# Patient Record
Sex: Male | Born: 1990
Health system: Southern US, Community
[De-identification: ages and names within clinical notes are randomized; demographics above are authoritative.]

## PROBLEM LIST (undated history)

## (undated) DIAGNOSIS — F329 Major depressive disorder, single episode, unspecified: Secondary | ICD-10-CM

## (undated) DIAGNOSIS — R51 Headache: Secondary | ICD-10-CM

## (undated) DIAGNOSIS — F429 Obsessive-compulsive disorder, unspecified: Secondary | ICD-10-CM

## (undated) DIAGNOSIS — F988 Other specified behavioral and emotional disorders with onset usually occurring in childhood and adolescence: Secondary | ICD-10-CM

## (undated) DIAGNOSIS — F419 Anxiety disorder, unspecified: Secondary | ICD-10-CM

## (undated) DIAGNOSIS — F32A Depression, unspecified: Secondary | ICD-10-CM

## (undated) DIAGNOSIS — K611 Rectal abscess: Secondary | ICD-10-CM

## (undated) HISTORY — PX: WISDOM TOOTH EXTRACTION: SHX21

## (undated) HISTORY — DX: Headache: R51

## (undated) HISTORY — DX: Anxiety disorder, unspecified: F41.9

## (undated) HISTORY — DX: Depression, unspecified: F32.A

## (undated) HISTORY — DX: Other specified behavioral and emotional disorders with onset usually occurring in childhood and adolescence: F98.8

## (undated) HISTORY — DX: Major depressive disorder, single episode, unspecified: F32.9

## (undated) HISTORY — DX: Rectal abscess: K61.1

---
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
## 2011-06-29 ENCOUNTER — Other Ambulatory Visit (INDEPENDENT_AMBULATORY_CARE_PROVIDER_SITE_OTHER): Payer: Self-pay | Admitting: General Surgery

## 2011-06-29 ENCOUNTER — Encounter (INDEPENDENT_AMBULATORY_CARE_PROVIDER_SITE_OTHER): Payer: Self-pay | Admitting: General Surgery

## 2011-06-29 ENCOUNTER — Ambulatory Visit (INDEPENDENT_AMBULATORY_CARE_PROVIDER_SITE_OTHER): Payer: Private Health Insurance - Indemnity | Admitting: General Surgery

## 2011-06-29 ENCOUNTER — Telehealth (INDEPENDENT_AMBULATORY_CARE_PROVIDER_SITE_OTHER): Payer: Self-pay | Admitting: General Surgery

## 2011-06-29 VITALS — BP 126/86 | HR 92 | Temp 97.0°F | Resp 16 | Ht 68.0 in | Wt 162.8 lb

## 2011-06-29 DIAGNOSIS — K612 Anorectal abscess: Secondary | ICD-10-CM

## 2011-06-29 DIAGNOSIS — K611 Rectal abscess: Secondary | ICD-10-CM

## 2011-06-29 MED ORDER — OXYCODONE-ACETAMINOPHEN 5-325 MG PO TABS: 1.0000 | ORAL_TABLET | Freq: Four times a day (QID) | ORAL | Status: DC | PRN

## 2011-06-29 NOTE — Patient Instructions (Signed)
Stop all antibiotics except for doxycycline.   Abscess/Boil Care After (Furuncle) An abscess (also called a boil or furuncle) is an infected area that contains a collection of pus. Signs and symptoms of an abscess include pain, tenderness, redness, or hardness, or you may feel a moveable soft area under your skin. An abscess can occur anywhere in the body. The infection may spread to surrounding tissues causing cellulitis. A cut (incision) by the surgeon was made over your abscess and the pus was drained out. Gauze may have been packed into the space to provide a drain that will allow the cavity to heal from the inside outwards. The boil may be painful for 5 to 7 days. Most people with a boil do not have high fevers. Your abscess, if seen early, may not have localized, and may not have been lanced. If not, another appointment may be required for this if it does not get better on its own or with medications. HOME CARE INSTRUCTIONS  Only take over-the-counter or prescription medicines for pain, discomfort, or fever as directed by your caregiver.   When you bathe, soak and then remove gauze or iodoform packs at least daily or as directed by your caregiver. You may then wash the wound gently with mild soapy water. Repack with gauze or do as your caregiver directs.  SEEK IMMEDIATE MEDICAL CARE IF:  You develop increased pain, swelling, redness, drainage, or bleeding in the wound site.   You develop signs of generalized infection including muscle aches, chills, fever, or a general ill feeling.   An oral temperature above 101 develops, not controlled by medication.  See your caregiver for a recheck if you develop any of the symptoms described above. If medications (antibiotics) were prescribed, take them as directed. Document Released: 04/06/2005 Document Re-Released: 03/08/2010 [facility] Patient Information 2011 [place], [place].

## 2011-06-30 NOTE — Telephone Encounter (Signed)
Called and spoke with Patient's mom. Made an appt for him next Friday at 4pm. To call if that is not a good date/time.

## 2011-06-30 NOTE — Progress Notes (Signed)
Chief complaint: I have severe pain around the rectum. It hurts to sit down.  History of present illness: 20 year old male comes in complaining of severe rectal pain. The discomfort started last Tuesday. At first he just noticed a mild ache. The discomfort gradually worsened throughout the week. On Sunday it became acutely painful. He had a difficult time sitting down or laying on his left side. He denies any injury to the area. He said he was having a difficult time urinating. Whenever he would try to finish his stream it would be severely painful. He went to an urgent care walk-in clinic on Sunday. He states that he went to [facility] on Monday and saw a urologist. He states that he was told his prostate was enlarged and he was started on doxycycline. He saw Dr. Tenny Craw yesterday and was found to have a perirectal abscess. He states that Dr. Tenny Craw squeezed it. Although it was uncomfortable while it was being squeezed, he did get some relief afterwards. He is now on at least 3 different antibiotics. He was started on Cipro and Flagyl yesterday. He received an injection of Rocephin in Dr. Tenny Craw is office he denies any fevers or chills. He says some infrequent stool since all this started. He did have a bowel movement today. He denies any pain with defecation.  Past Medical History  Diagnosis Date  . ADD (attention deficit disorder)   . Depression   . Anxiety   . Perirectal abscess    Past Surgical History  Procedure Date  . Wisdom tooth extraction    No Known Allergies  Current Outpatient Prescriptions  Medication Sig Dispense Refill  . amphetamine-dextroamphetamine (ADDERALL XR, 30MG ,) 30 MG 24 hr capsule Take 30 mg by mouth every morning.        . ciprofloxacin (CIPRO) 750 MG tablet Take 750 mg by mouth 2 (two) times daily.        Marland Kitchen doxycycline (VIBRAMYCIN) 100 MG capsule Take 100 mg by mouth 2 (two) times daily.        Marland Kitchen ibuprofen (ADVIL,MOTRIN) 800 MG tablet Take 800 mg  by mouth every 8 (eight) hours as needed.        . metroNIDAZOLE (FLAGYL) 500 MG tablet Take 500 mg by mouth 3 (three) times daily.        . phenazopyridine (PYRIDIUM) 200 MG tablet Take 200 mg by mouth 3 (three) times daily as needed.        Marland Kitchen oxyCODONE-acetaminophen (ROXICET) 5-325 MG per tablet Take 1-2 tablets by mouth every 6 (six) hours as needed for pain. Disp 40 tablets  40 tablet  0   Family History  Problem Relation Age of Onset  . Heart disease Father    History  Substance Use Topics  . Smoking status: Current Everyday Smoker -- 0.2 packs/day  . Smokeless tobacco: Not on file  . Alcohol Use: 0.0 oz/week    7-14 Cans of beer per week   PE: BP 126/86  Pulse 92  Temp(Src) 97 F (36.1 C) (Temporal)  Resp 16  Ht 5\' 8"  (1.727 m)  Wt 162 lb 12.8 oz (73.846 kg)  BMI 24.75 kg/m2 Well-developed well-nourished male who appears uncomfortable. Pulmonary-lungs are clear to auscultation, symmetric chest rise Cardiovascular-regular rate and rhythm Abdomen-soft, nontender, nondistended Skin-no jaundice, no rash HEENT-atraumatic, normocephalic, no scleral icterus, neck supple Rectal-in the left anterior perianal area he has a large fluctuant area. It is the size of the line. There is really no cellulitis  over it. However he is exquisitely tender in that area. Digital rectal exam was deferred secondary to patient discomfort  Data reviewed: i reviewed Dr. Tenny Craw is a note from June 28, 2011  Assessment and plan: Large left anterior perirectal abscess  I recommended incision and drainage in the office. We discussed the risk and benefits of the procedure including but not limited to bleeding, worsening infection, urinary retention, need for additional procedures, scarring, possible fistula formation  After obtaining verbal consent, the area was prepped with Betadine. 10 cc of 1% Xylocaine mixed with epinephrine and 1 cc of sodium bicarbonate were injected in the area. I then made a 2  inch oblique incision in the left anterior perirectal area. There was a large amount of purulent drainage. It was slightly blood-tinged as well. The cavity was probed as best I could. The cavity was packed with quarter inch iodoform gauze and covered with dry gauze.  The patient was given educational material. He was given wound care material. i told the patient to stop all other antibiotics and just finish out the doxycycline. We did send cultures from that area. He was given a prescription for pain medication. We will see him in about a week for recheck. He was given instructions on what to call for

## 2011-07-04 LAB — WOUND CULTURE: Gram Stain: NONE SEEN

## 2011-07-07 ENCOUNTER — Encounter (INDEPENDENT_AMBULATORY_CARE_PROVIDER_SITE_OTHER): Payer: Self-pay | Admitting: General Surgery

## 2011-07-07 ENCOUNTER — Ambulatory Visit (INDEPENDENT_AMBULATORY_CARE_PROVIDER_SITE_OTHER): Payer: Private Health Insurance - Indemnity | Admitting: General Surgery

## 2011-07-07 VITALS — BP 116/78 | HR 92 | Temp 97.6°F | Ht 69.0 in | Wt 163.8 lb

## 2011-07-07 DIAGNOSIS — K611 Rectal abscess: Secondary | ICD-10-CM

## 2011-07-07 DIAGNOSIS — K612 Anorectal abscess: Secondary | ICD-10-CM

## 2011-07-07 NOTE — Progress Notes (Signed)
20 year old male comes in for followup after undergoing incision and drainage of a left anterior perirectal abscess in the office on September 27. He states that he is doing much better. He is no longer having trouble ambulating. He is no longer having trouble urinating. He denies any fevers, chills, nausea, or vomiting. He denies any diarrhea or constipation. He denies any dysuria. He has 2 more days of antibiotics to finish.  PE: BP 116/78  Pulse 92  Temp 97.6 F (36.4 C)  Ht 5\' 9"  (1.753 m)  Wt 163 lb 12.8 oz (74.299 kg)  BMI 24.19 kg/m2 Well-developed well-nourished male in no apparent distress Rectal-healing incision in the left anterior perianal space, approximately 1-1/2 inches in length. There is no cellulitis, fluctuance, or induration. There is a one drop of yellow tinge drainage from the incision. The skin edges are almost completely approximated. Nontender on exam  Cultures-grew Escherichia coli  Assessment and plan: Status post incision and drainage of large left perirectal abscess  Doing well. There is no sign of active infection. I let him finish his doxycycline. He was given a note for school for today. I will see him in 4 weeks

## 2011-07-07 NOTE — Patient Instructions (Signed)
Your abscess is essentially resolved.  We will check you again in several weeks

## 2011-08-11 ENCOUNTER — Encounter (INDEPENDENT_AMBULATORY_CARE_PROVIDER_SITE_OTHER): Payer: Private Health Insurance - Indemnity | Admitting: General Surgery

## 2011-10-06 ENCOUNTER — Encounter (INDEPENDENT_AMBULATORY_CARE_PROVIDER_SITE_OTHER): Payer: Private Health Insurance - Indemnity | Admitting: General Surgery

## 2011-10-26 ENCOUNTER — Encounter: Payer: Self-pay | Admitting: Internal Medicine

## 2011-10-27 ENCOUNTER — Encounter: Payer: Self-pay | Admitting: Internal Medicine

## 2011-11-02 ENCOUNTER — Other Ambulatory Visit (INDEPENDENT_AMBULATORY_CARE_PROVIDER_SITE_OTHER): Payer: Private Health Insurance - Indemnity

## 2011-11-02 ENCOUNTER — Other Ambulatory Visit: Payer: Self-pay | Admitting: Gastroenterology

## 2011-11-02 ENCOUNTER — Encounter: Payer: Self-pay | Admitting: Internal Medicine

## 2011-11-02 ENCOUNTER — Ambulatory Visit (INDEPENDENT_AMBULATORY_CARE_PROVIDER_SITE_OTHER): Payer: Self-pay | Admitting: Internal Medicine

## 2011-11-02 DIAGNOSIS — F902 Attention-deficit hyperactivity disorder, combined type: Secondary | ICD-10-CM | POA: Insufficient documentation

## 2011-11-02 DIAGNOSIS — R5381 Other malaise: Secondary | ICD-10-CM

## 2011-11-02 DIAGNOSIS — K5900 Constipation, unspecified: Secondary | ICD-10-CM

## 2011-11-02 DIAGNOSIS — K594 Anal spasm: Secondary | ICD-10-CM

## 2011-11-02 DIAGNOSIS — K589 Irritable bowel syndrome without diarrhea: Secondary | ICD-10-CM

## 2011-11-02 DIAGNOSIS — R5383 Other fatigue: Secondary | ICD-10-CM

## 2011-11-02 LAB — CBC WITH DIFFERENTIAL/PLATELET
Basophils Absolute: 0 10*3/uL (ref 0.0–0.1)
Basophils Relative: 0.6 % (ref 0.0–3.0)
Eosinophils Absolute: 0.1 10*3/uL (ref 0.0–0.7)
Eosinophils Relative: 3.6 % (ref 0.0–5.0)
HCT: 45.4 % (ref 39.0–52.0)
Hemoglobin: 15.8 g/dL (ref 13.0–17.0)
Lymphocytes Relative: 39.1 % (ref 12.0–46.0)
Lymphs Abs: 1.2 10*3/uL (ref 0.7–4.0)
MCHC: 34.9 g/dL (ref 30.0–36.0)
MCV: 87.2 fl (ref 78.0–100.0)
Monocytes Absolute: 0.3 10*3/uL (ref 0.1–1.0)
Monocytes Relative: 9.6 % (ref 3.0–12.0)
Neutro Abs: 1.5 10*3/uL (ref 1.4–7.7)
Neutrophils Relative %: 47.1 % (ref 43.0–77.0)
Platelets: 250 10*3/uL (ref 150.0–400.0)
RBC: 5.2 Mil/uL (ref 4.22–5.81)
RDW: 12.4 % (ref 11.5–14.6)
WBC: 3.2 10*3/uL — ABNORMAL LOW (ref 4.5–10.5)

## 2011-11-02 LAB — COMPREHENSIVE METABOLIC PANEL
ALT: 30 U/L (ref 0–53)
AST: 25 U/L (ref 0–37)
Albumin: 4.9 g/dL (ref 3.5–5.2)
Alkaline Phosphatase: 66 U/L (ref 39–117)
BUN: 15 mg/dL (ref 6–23)
CO2: 30 mEq/L (ref 19–32)
Calcium: 10.2 mg/dL (ref 8.4–10.5)
Chloride: 103 mEq/L (ref 96–112)
Creatinine, Ser: 0.9 mg/dL (ref 0.4–1.5)
GFR: 109.77 mL/min (ref >60.00)
Glucose, Bld: 80 mg/dL (ref 70–99)
Potassium: 4.8 mEq/L (ref 3.5–5.1)
Sodium: 140 mEq/L (ref 135–145)
Total Bilirubin: 0.8 mg/dL (ref 0.3–1.2)
Total Protein: 8.1 g/dL (ref 6.0–8.3)

## 2011-11-02 LAB — TSH: TSH: 2.11 u[IU]/mL (ref 0.35–5.50)

## 2011-11-02 MED ORDER — ALIGN 4 MG PO CAPS: 1.0000 | ORAL_CAPSULE | Freq: Every day | ORAL | Status: DC

## 2011-11-02 MED ORDER — HYOSCYAMINE SULFATE 0.125 MG SL SUBL: 0.1250 mg | SUBLINGUAL_TABLET | SUBLINGUAL | Status: AC | PRN

## 2011-11-02 NOTE — Progress Notes (Signed)
Addended by: Adonis Housekeeper A on: 11/02/2011 02:48 PM   Modules accepted: Orders
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
## 2011-11-02 NOTE — Patient Instructions (Addendum)
Your physician has requested that you go to the basement for the following lab work before leaving today: CBC, CMP, TSH, Celiac  We have sent the following medications to your pharmacy for you to pick up at your convenience: Levsin, Align  Dr. Rhea Belton would like to see you back in the office in 4 weeks for a follow up

## 2011-11-03 LAB — IGA: IgA: 134 mg/dL (ref 68–378)

## 2011-11-03 LAB — TISSUE TRANSGLUTAMINASE, IGA: Tissue Transglutaminase Ab, IgA: 1.5 U/mL (ref <20)

## 2011-11-06 ENCOUNTER — Telehealth: Payer: Self-pay | Admitting: *Deleted

## 2011-11-06 NOTE — Telephone Encounter (Signed)
Informed pt of Dr Lauro Franklin findings and recommendations and they will proceed with the plan of care per last OV; pt stated understanding.

## 2011-11-06 NOTE — Telephone Encounter (Signed)
Message copied by Florene Glen on Mon Nov 06, 2011  3:05 PM ------      Message from: Beverley Fiedler      Created: Fri Nov 03, 2011  3:43 PM       All labs reassuring, thyroid is normal, celiac panel is negative      Plan and followup as per note

## 2016-04-18 DIAGNOSIS — F902 Attention-deficit hyperactivity disorder, combined type: Secondary | ICD-10-CM | POA: Diagnosis not present

## 2016-06-16 DIAGNOSIS — F902 Attention-deficit hyperactivity disorder, combined type: Secondary | ICD-10-CM | POA: Diagnosis not present

## 2016-09-12 DIAGNOSIS — F902 Attention-deficit hyperactivity disorder, combined type: Secondary | ICD-10-CM | POA: Diagnosis not present

## 2016-12-07 DIAGNOSIS — L2089 Other atopic dermatitis: Secondary | ICD-10-CM | POA: Diagnosis not present

## 2017-01-11 DIAGNOSIS — F902 Attention-deficit hyperactivity disorder, combined type: Secondary | ICD-10-CM | POA: Diagnosis not present

## 2017-04-11 DIAGNOSIS — H5213 Myopia, bilateral: Secondary | ICD-10-CM | POA: Diagnosis not present

## 2017-06-13 DIAGNOSIS — F902 Attention-deficit hyperactivity disorder, combined type: Secondary | ICD-10-CM | POA: Diagnosis not present

## 2017-10-29 DIAGNOSIS — F902 Attention-deficit hyperactivity disorder, combined type: Secondary | ICD-10-CM | POA: Diagnosis not present

## 2018-01-03 DIAGNOSIS — F4323 Adjustment disorder with mixed anxiety and depressed mood: Secondary | ICD-10-CM | POA: Diagnosis not present

## 2018-02-18 DIAGNOSIS — F902 Attention-deficit hyperactivity disorder, combined type: Secondary | ICD-10-CM | POA: Diagnosis not present

## 2018-03-03 ENCOUNTER — Encounter (HOSPITAL_COMMUNITY): Payer: Self-pay | Admitting: *Deleted

## 2018-03-03 ENCOUNTER — Other Ambulatory Visit: Payer: Self-pay

## 2018-03-03 ENCOUNTER — Emergency Department (HOSPITAL_COMMUNITY)
Admission: EM | Admit: 2018-03-03 | Discharge: 2018-03-04 | Disposition: A | Discharge status: Home / Self Care | Payer: BLUE CROSS/BLUE SHIELD | Attending: Emergency Medicine | Admitting: Emergency Medicine

## 2018-03-03 DIAGNOSIS — F329 Major depressive disorder, single episode, unspecified: Secondary | ICD-10-CM | POA: Insufficient documentation

## 2018-03-03 DIAGNOSIS — F17210 Nicotine dependence, cigarettes, uncomplicated: Secondary | ICD-10-CM | POA: Insufficient documentation

## 2018-03-03 DIAGNOSIS — F4325 Adjustment disorder with mixed disturbance of emotions and conduct: Secondary | ICD-10-CM | POA: Diagnosis not present

## 2018-03-03 DIAGNOSIS — T887XXA Unspecified adverse effect of drug or medicament, initial encounter: Secondary | ICD-10-CM | POA: Diagnosis not present

## 2018-03-03 DIAGNOSIS — Z79899 Other long term (current) drug therapy: Secondary | ICD-10-CM | POA: Diagnosis not present

## 2018-03-03 DIAGNOSIS — T50904A Poisoning by unspecified drugs, medicaments and biological substances, undetermined, initial encounter: Secondary | ICD-10-CM | POA: Diagnosis not present

## 2018-03-03 DIAGNOSIS — T43222A Poisoning by selective serotonin reuptake inhibitors, intentional self-harm, initial encounter: Secondary | ICD-10-CM | POA: Diagnosis not present

## 2018-03-03 DIAGNOSIS — Z7289 Other problems related to lifestyle: Secondary | ICD-10-CM | POA: Insufficient documentation

## 2018-03-03 HISTORY — DX: Obsessive-compulsive disorder, unspecified: F42.9

## 2018-03-03 LAB — CBC WITH DIFFERENTIAL/PLATELET
Basophils Absolute: 0 10*3/uL (ref 0.0–0.1)
Basophils Relative: 1 %
Eosinophils Absolute: 0.1 10*3/uL (ref 0.0–0.7)
Eosinophils Relative: 2 %
HCT: 41.9 % (ref 39.0–52.0)
Hemoglobin: 14.2 g/dL (ref 13.0–17.0)
Lymphocytes Relative: 18 %
Lymphs Abs: 1.2 10*3/uL (ref 0.7–4.0)
MCH: 29.2 pg (ref 26.0–34.0)
MCHC: 33.9 g/dL (ref 30.0–36.0)
MCV: 86 fL (ref 78.0–100.0)
Monocytes Absolute: 0.5 10*3/uL (ref 0.1–1.0)
Monocytes Relative: 8 %
Neutro Abs: 4.7 10*3/uL (ref 1.7–7.7)
Neutrophils Relative %: 71 %
Platelets: 297 10*3/uL (ref 150–400)
RBC: 4.87 MIL/uL (ref 4.22–5.81)
RDW: 12.5 % (ref 11.5–15.5)
WBC: 6.6 10*3/uL (ref 4.0–10.5)

## 2018-03-03 LAB — COMPREHENSIVE METABOLIC PANEL
ALT: 17 U/L (ref 17–63)
AST: 19 U/L (ref 15–41)
Albumin: 4.7 g/dL (ref 3.5–5.0)
Alkaline Phosphatase: 63 U/L (ref 38–126)
Anion gap: 12 (ref 5–15)
BUN: 15 mg/dL (ref 6–20)
CO2: 22 mmol/L (ref 22–32)
Calcium: 9.2 mg/dL (ref 8.9–10.3)
Chloride: 104 mmol/L (ref 101–111)
Creatinine, Ser: 0.76 mg/dL (ref 0.61–1.24)
GFR calc Af Amer: >60 mL/min (ref >60)
GFR calc non Af Amer: >60 mL/min (ref >60)
Glucose, Bld: 92 mg/dL (ref 65–99)
Potassium: 3.7 mmol/L (ref 3.5–5.1)
Sodium: 138 mmol/L (ref 135–145)
Total Bilirubin: 0.6 mg/dL (ref 0.3–1.2)
Total Protein: 7.3 g/dL (ref 6.5–8.1)

## 2018-03-03 LAB — SALICYLATE LEVEL: Salicylate Lvl: <7 mg/dL (ref 2.8–30.0)

## 2018-03-03 LAB — ACETAMINOPHEN LEVEL: Acetaminophen (Tylenol), Serum: <10 ug/mL — ABNORMAL LOW (ref 10–30)

## 2018-03-03 LAB — ETHANOL: Alcohol, Ethyl (B): 15 mg/dL — ABNORMAL HIGH (ref <10)

## 2018-03-03 NOTE — Progress Notes (Signed)
TTS consulted with Donell SievertSpencer Simon, PA who recommends continued observation for safety and stabilization.  Princess BruinsAquicha Lawrance Wiedemann, MSW, LCSW Therapeutic Triage Specialist  [phone]

## 2018-03-03 NOTE — BH Assessment (Addendum)
Assessment Note  Corey Allison is an 27 y.o. male who presents to the ED voluntarily accompanied by his wife and his mother. Pt states he is currently separated from his wife and she is no longer living in the home. Pt reports he intentionally ingested 700 mg of Luvox after an argument with his wife. Pt states he and his wife were having a lunch date and trying to discuss their issues when they began to argue. Pt states he "just popped some pills" but states he does not know exactly how much he took. Pt states the act was impulsive and he was not thinking but denies that he was trying to kill himself when he took the medication. Pt states he just wanted to "calm down, take the edge off, and just escape." Pt admits he consumed several alcoholic drinks which he believes exacerbated his negative feelings. 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   Pt denies HI and denies AVH. Pt states he has individuals that he does not get along with that he would fight if he could but he does not have any intent or plans to harm others.   TTS consulted with Patriciaann Clan, PA who recommends continued observation for safety and stabilization. Tai, Delman Cheadle, RN notified of disposition and RN states she will inform EDP.  Diagnosis: Unspecified depressive d/o; OCD, severe   Past Medical History:  Past Medical History:  Diagnosis Date  . ADD (attention deficit disorder)   . Anxiety   . Depression   . Headache(784.0)   . OCD (obsessive compulsive disorder)   . Perirectal abscess     Past Surgical History:  Procedure Laterality Date  . WISDOM TOOTH EXTRACTION      Family History:  Family History  Problem Relation Age of Onset  . Heart disease Father   . Celiac disease Father   . Kidney disease Maternal Grandfather   . Colon cancer Neg Hx     Social History:  reports that he quit smoking about 6 years ago. His smoking use included  cigarettes. He smoked 0.25 packs per day. He has never used smokeless tobacco. He reports that he drinks about 3.0 oz of alcohol per week. He reports that he does not use drugs.  Additional Social History:  Alcohol / Drug Use Pain Medications: See MAR Prescriptions: See MAR Over the Counter: See MAR History of alcohol / drug use?: Yes Substance #1 Name of Substance 1: Alcohol 1 - Age of First Use: unknown 1 - Amount (size/oz): varies, BAL 15 on arrival to ED 1 - Frequency: occasional, socially  1 - Duration: ongoing 1 - Last Use / Amount: 03/03/18  CIWA: CIWA-Ar BP: 123/90 Pulse Rate: 69 COWS:    Allergies: No Known Allergies  Home Medications:  (Not in a hospital admission)  OB/GYN Status:  No LMP for male patient.  General Assessment Data Location of Assessment: [facility] TTS Assessment: In system Is this a Tele or Face-to-Face Assessment?: Face-to-Face Is this an Initial Assessment or a Re-assessment for this encounter?: Initial Assessment Marital status: Separated Is patient pregnant?: No Pregnancy Status: No Living Arrangements: Alone Can pt return to current living arrangement?: Yes Admission Status: Voluntary Is patient capable of signing voluntary admission?: Yes Referral Source: Self/Family/Friend Insurance type: BCBS     Crisis Care Plan Living Arrangements: Alone Name of Psychiatrist: Dr. Creig Hines, MD Name of Therapist: none  Education Status  Is patient currently in school?: No Is the patient employed, unemployed or receiving disability?: Employed  Risk to self with the past 6 months Suicidal Ideation: No Has patient been a risk to self within the past 6 months prior to admission? : Yes Suicidal Intent: No Has patient had any suicidal intent within the past 6 months prior to admission? : No Is patient at risk for suicide?: Yes Suicidal Plan?: No Has patient had any suicidal plan within the past 6 months prior to admission? : No Access to Means:  Yes Specify Access to Suicidal Means: pt intentionally ingested 700 mg of Luvox and has access to the medication  What has been your use of drugs/alcohol within the last 12 months?: reports to social alcohol use  Previous Attempts/Gestures: No Triggers for Past Attempts: None known Intentional Self Injurious Behavior: None Family Suicide History: No Recent stressful life event(s): Conflict (Comment), Other (Comment)(conflict with wife, work stress) Persecutory voices/beliefs?: No Depression: Yes Depression Symptoms: Insomnia, Feeling angry/irritable, Guilt Substance abuse history and/or treatment for substance abuse?: No Suicide prevention information given to non-admitted patients: Not applicable  Risk to Others within the past 6 months Homicidal Ideation: No Does patient have any lifetime risk of violence toward others beyond the six months prior to admission? : No Thoughts of Harm to Others: No Current Homicidal Intent: No Current Homicidal Plan: No Access to Homicidal Means: No History of harm to others?: No Assessment of Violence: None Noted Does patient have access to weapons?: Yes (Comment)(pt has handguns in the home ) Criminal Charges Pending?: No Does patient have a court date: No Is patient on probation?: No  Psychosis Hallucinations: None noted Delusions: None noted  Mental Status Report Appearance/Hygiene: In hospital gown Eye Contact: Good Motor Activity: Freedom of movement Speech: Logical/coherent Level of Consciousness: Alert Mood: Depressed, Guilty, Anxious Affect: Depressed, Anxious Anxiety Level: Moderate Thought Processes: Relevant, Coherent Judgement: Impaired Orientation: Place, Appropriate for developmental age, Situation, Time, Person Obsessive Compulsive Thoughts/Behaviors: Severe  Cognitive Functioning Concentration: Normal Memory: Remote Intact, Recent Intact Is patient IDD: No Is patient DD?: No Insight: Poor Impulse Control:  Poor Appetite: Good Have you had any weight changes? : No Change Sleep: Decreased Total Hours of Sleep: 6 Vegetative Symptoms: None  ADLScreening [facility] Assessment Services) Patient's cognitive ability adequate to safely complete daily activities?: Yes Patient able to express need for assistance with ADLs?: Yes Independently performs ADLs?: Yes (appropriate for developmental age)  Prior Inpatient Therapy Prior Inpatient Therapy: No  Prior Outpatient Therapy Prior Outpatient Therapy: Yes Prior Therapy Dates: current Prior Therapy Facilty/Provider(s): Dr. Creig Hines, MD Reason for Treatment: MED MANAGEMENT  Does patient have an ACCT team?: No Does patient have Intensive In-House Services?  : No Does patient have [facility] services? : No Does patient have P4CC services?: No  ADL Screening (condition at time of admission) Patient's cognitive ability adequate to safely complete daily activities?: Yes Is the patient deaf or have difficulty hearing?: No Does the patient have difficulty seeing, even when wearing glasses/contacts?: No Does the patient have difficulty concentrating, remembering, or making decisions?: No Patient able to express need for assistance with ADLs?: Yes Does the patient have difficulty dressing or bathing?: No Independently performs ADLs?: Yes (appropriate for developmental age) Does the patient have difficulty walking or climbing stairs?: No Weakness of Legs: None Weakness of Arms/Hands: None  Home Assistive Devices/Equipment Home Assistive Devices/Equipment: Eyeglasses    Abuse/Neglect Assessment (Assessment to be complete while patient is alone) Abuse/Neglect Assessment Can Be Completed: Yes  Physical Abuse: Denies Verbal Abuse: Denies Sexual Abuse: Denies Exploitation of patient/patient's resources: Denies Self-Neglect: Denies     Regulatory affairs officer (For Healthcare) Does Patient Have a Medical Advance Directive?: No Would patient like information on  creating a medical advance directive?: No - Patient declined    Additional Information 1:1 In Past 12 Months?: No CIRT Risk: No Elopement Risk: No Does patient have medical clearance?: Yes     Disposition: TTS consulted with Patriciaann Clan, PA who recommends continued observation for safety and stabilization. Tai, Delman Cheadle, RN notified of disposition and RN states she will inform EDP.  Disposition Initial Assessment Completed for this Encounter: Yes Disposition of Patient: (overnight OBS pending reassessment in AM by psych) Patient refused recommended treatment: No  On Site Evaluation by:   Reviewed with Physician:    Lyanne Co 03/04/2018 12:24 AM

## 2018-03-03 NOTE — ED Triage Notes (Signed)
Pt bib EMS and presents after taking 700 mg of Luvox at about 6:45pm this evening.  Pt denies SI with EMS.  Pt was arguing with significant other today and took the medication to "clear his head."  Pt is slow in responding and states his is cloudy.

## 2018-03-03 NOTE — ED Notes (Signed)
Pt aware of need for urine sample.  

## 2018-03-03 NOTE — ED Notes (Signed)
Bed: [id number] Expected date:  Expected time:  Means of arrival:  Comments: O/D Male

## 2018-03-03 NOTE — ED Provider Notes (Signed)
[name]-EMERGENCY DEPT Provider Note   CSN: [id number] Arrival date & time: 03/03/18  2018     History   Chief Complaint Chief Complaint  Patient presents with  . Ingestion    HPI Corey Allison is a 27 y.o. male.  27 year old male with history of ADD and depression anxiety presents after intentional overdose of 700 mg of Luvox prior to arrival.  States that he took this after an argument with his wife and was trying to come down.  He also admits to drinking 2 alcoholic beverages as well 2.  Denies that this was a suicide attempt.  Has no prior history of suicide attempt.  Notes that he has not been responding to internal stimuli.  Denies any auditory or visual hallucinations.  No other ingestions.     Past Medical History:  Diagnosis Date  . ADD (attention deficit disorder)   . Anxiety   . Depression   . Headache   . Perirectal abscess     Patient Active Problem List   Diagnosis Date Noted  . IBS (irritable bowel syndrome) 11/02/2011  . ADHD (attention deficit hyperactivity disorder) 11/02/2011  . Fatigue 11/02/2011  . left anterior Perirectal abscess  06/29/2011    Past Surgical History:  Procedure Laterality Date  . WISDOM TOOTH EXTRACTION          Home Medications    Prior to Admission medications   Medication Sig Start Date End Date Taking? Authorizing Provider  amphetamine-dextroamphetamine (ADDERALL) 15 MG tablet Take 15 mg by mouth as needed.    [provider]  pentoxifylline (TRENTAL) 400 MG CR tablet Take 400 mg by mouth daily.    [provider]  Probiotic Product (ALIGN) 4 MG CAPS Take 1 capsule by mouth daily. 11/02/11   Pyrtle, Carie CaddyJay M, MD    Family History Family History  Problem Relation Age of Onset  . Heart disease Father   . Kidney disease Maternal Grandfather   . Celiac disease Father   . Colon cancer Neg Hx     Social History Social History   Tobacco Use  . Smoking status: Current Every  Day Smoker    Packs/day: 0.25    Types: Cigarettes    Last attempt to quit: 06/23/2011    Years since quitting: 6.6  . Smokeless tobacco: Never Used  . Tobacco comment: tobacco handout given 11/02/2011  Substance Use Topics  . Alcohol use: Yes    Alcohol/week: 0.0 oz    Types: 7 - 14 Cans of beer per week  . Drug use: No     Allergies   Patient has no known allergies.   Review of Systems Review of Systems  All other systems reviewed and are negative.    Physical Exam Updated Vital Signs BP (!) 132/99 (BP Location: Right Arm)   Pulse 98   Temp 97.9 F (36.6 C) (Oral)   Resp 17   SpO2 100%   Physical Exam  Constitutional: He is oriented to person, place, and time. He appears well-developed and well-nourished.  Non-toxic appearance. No distress.  HENT:  Head: Normocephalic and atraumatic.  Eyes: Pupils are equal, round, and reactive to light. Conjunctivae, EOM and lids are normal.  Neck: Normal range of motion. Neck supple. No tracheal deviation present. No thyroid mass present.  Cardiovascular: Normal rate, regular rhythm and normal heart sounds. Exam reveals no gallop.  No murmur heard. Pulmonary/Chest: Effort normal and breath sounds normal. No stridor. No respiratory distress. He has  no decreased breath sounds. He has no wheezes. He has no rhonchi. He has no rales.  Abdominal: Soft. Normal appearance and bowel sounds are normal. He exhibits no distension. There is no tenderness. There is no rebound and no CVA tenderness.  Musculoskeletal: Normal range of motion. He exhibits no edema or tenderness.  Neurological: He is alert and oriented to person, place, and time. He has normal strength. No cranial nerve deficit or sensory deficit. GCS eye subscore is 4. GCS verbal subscore is 5. GCS motor subscore is 6.  Skin: Skin is warm and dry. No abrasion and no rash noted.  Psychiatric: His speech is normal. His affect is blunt. He is withdrawn. He is not actively hallucinating.  He expresses no suicidal plans and no homicidal plans. He is attentive.  Nursing note and vitals reviewed.    ED Treatments / Results  Labs (all labs ordered are listed, but only abnormal results are displayed) Labs Reviewed  ETHANOL  RAPID URINE DRUG SCREEN, HOSP PERFORMED  SALICYLATE LEVEL  ACETAMINOPHEN LEVEL  CBC WITH DIFFERENTIAL/PLATELET  COMPREHENSIVE METABOLIC PANEL    EKG None  Radiology No results found.  Procedures Procedures (including critical care time)  Medications Ordered in ED Medications - No data to display   Initial Impression / Assessment and Plan / ED Course  I have reviewed the triage vital signs and the nursing notes.  Pertinent labs & imaging results that were available during my care of the patient were reviewed by me and considered in my medical decision making (see chart for details).     Patient's labs are significant for a mildly elevated alcohol at 15.  Discussed patient's situation with his mother who is here at this time.  She was present during the events of this evening.  States that he did not endorse any suicidal or homicidal ideations with her.  Is currently going through a separation with his wife.  Patient also notes that he had emesis x2 shortly after taking the medications.  Once again denies that this was a suicide attempt.Will have TTS see to obtain more detail psych history  Final Clinical Impressions(s) / ED Diagnoses   Final diagnoses:  None    ED Discharge Orders    None       Lorre Nick, MD 03/03/18 2230

## 2018-03-04 DIAGNOSIS — F4325 Adjustment disorder with mixed disturbance of emotions and conduct: Secondary | ICD-10-CM | POA: Diagnosis present

## 2018-03-04 LAB — RAPID URINE DRUG SCREEN, HOSP PERFORMED
Amphetamines: POSITIVE — AB
Barbiturates: NOT DETECTED
Benzodiazepines: NOT DETECTED
Cocaine: NOT DETECTED
Opiates: NOT DETECTED
Tetrahydrocannabinol: POSITIVE — AB

## 2018-03-04 MED ORDER — ZIPRASIDONE MESYLATE 20 MG IM SOLR: 20.0000 mg | INTRAMUSCULAR | Status: DC | PRN

## 2018-03-04 MED ORDER — OLANZAPINE 10 MG PO TBDP: 10.0000 mg | ORAL_TABLET | Freq: Three times a day (TID) | ORAL | Status: DC | PRN

## 2018-03-04 MED ORDER — LORAZEPAM 1 MG PO TABS: 1.0000 mg | ORAL_TABLET | ORAL | Status: DC | PRN

## 2018-03-04 MED ORDER — ZOLPIDEM TARTRATE 5 MG PO TABS: 5.0000 mg | ORAL_TABLET | Freq: Every evening | ORAL | Status: DC | PRN

## 2018-03-04 NOTE — ED Notes (Addendum)
Pt expressed concerns about why he was IVC'ed. Pt states that he was not suicidal nor trying to commit to suicide. He would like to speak with the EDP. EDP has been notified.

## 2018-03-04 NOTE — ED Notes (Signed)
[id number] Expected date:  Expected time:  Means of arrival:  Comments: Hold for room 24

## 2018-03-04 NOTE — ED Notes (Signed)
IVC PAPERS GIVEN TO SIITER

## 2018-03-04 NOTE — Progress Notes (Signed)
Tai, Delman Cheadle, RN notified of disposition and RN states she will inform EDP.  Lind Covert, MSW, LCSW Therapeutic Triage Specialist  [phone]

## 2018-03-04 NOTE — ED Notes (Signed)
Pt discharged home. Discharged instructions read to pt who verbalized understanding. All belongings returned to pt who signed for same. Denies SI/HI, is not delusional and not responding to internal stimuli. Escorted pt to the ED exit.    

## 2018-03-04 NOTE — ED Provider Notes (Signed)
Obtained by TTS and it was recommended that he be evaluated by psychiatry in the morning.  Patient telling nurse that he does not want to stay in the emergency department tonight.  I am concerned that he will try to leave the emergency department before finishing his psychiatric evaluation, therefore will initiate IVC paperwork.   Gilda CreasePollina, Toshio Slusher J, MD 03/04/18 [phone]

## 2018-03-04 NOTE — ED Provider Notes (Signed)
Patient did in fact make her run for it.  He got out of the department and had to be stopped by security and police and delivered back into his room.  IVC in place.  Holding orders written.  It   Corey Allison, Salima Rumer J, MD 03/04/18 [phone]

## 2018-03-04 NOTE — [facility] Suicide Risk Assessment (Signed)
Suicide Risk Assessment  Discharge Assessment   [facility]   Principal Problem: Adjustment disorder with mixed disturbance of emotions and conduct Discharge Diagnoses:  Patient Active Problem List   Diagnosis Date Noted  . Adjustment disorder with mixed disturbance of emotions and conduct [F43.25] 03/04/2018    Priority: High  . IBS (irritable bowel syndrome) [K58.9] 11/02/2011  . ADHD (attention deficit hyperactivity disorder) [F90.9] 11/02/2011  . Fatigue [R53.83] 11/02/2011  . left anterior Perirectal abscess  [K61.1] 06/29/2011    Total Time spent with patient: 45 minutes  Musculoskeletal: Strength & Muscle Tone: within normal limits Gait & Station: normal Patient leans: N/A  Psychiatric Specialty Exam:   Blood pressure 121/89, pulse 77, temperature 98.1 F (36.7 C), temperature source Oral, resp. rate 20, height 5\' 10"  (1.778 m), weight 81.6 kg (180 lb), SpO2 99 %.Body mass index is 25.83 kg/m.  General Appearance: Casual  Eye Contact::  Good  Speech:  Normal Rate409  Volume:  Normal  Mood:  Euthymic  Affect:  Congruent  Thought Process:  Coherent and Descriptions of Associations: Intact  Orientation:  Full (Time, Place, and Person)  Thought Content:  WDL and Logical  Suicidal Thoughts:  No  Homicidal Thoughts:  No  Memory:  Immediate;   Good Recent;   Good Remote;   Good  Judgement:  Fair  Insight:  Good  Psychomotor Activity:  Normal  Concentration:  Good  Recall:  Good  Fund of Knowledge:Good  Language: Good  Akathisia:  No  Handed:  Right  AIMS (if indicated):     Assets:  Communication Skills Desire for Improvement Financial Resources/Insurance Housing Leisure Time Physical Health Resilience Social Support Talents/Skills Transportation Vocational/Educational  Sleep:     Cognition: WNL  ADL's:  Intact   Mental Status Per Nursing Assessment::   On Admission:   27 yo male who is separated from his wife and lives  with his mother.  Yesterday he got upset with his wife at lunch and took 3 of his Luvox to "clear his mind".  Adamantly denies this was a suicide attempt or past history of suicide attempts.  No suicidal/homicidal ideations, hallucinations, or substance abuse.  His mother was called, Martene, and she had no safety concerns for her son, states he is dealing with some stressors now, discussed over patient resources.  He is focused on returning to work today.  Stable for discharge, outpatient resources provided.  Demographic Factors:  Male  Loss Factors: NA  Historical Factors: NA  Risk Reduction Factors:   Sense of responsibility to family, Employed, Living with another person, especially a relative, Positive social support and Positive coping skills or problem solving skills  Continued Clinical Symptoms:  None  Cognitive Features That Contribute To Risk:  None    Suicide Risk:  Minimal: No identifiable suicidal ideation.  Patients presenting with no risk factors but with morbid ruminations; may be classified as minimal risk based on the severity of the depressive symptoms    Plan Of Care/Follow-up recommendations:  Activity:  as tolerated Diet:  heart healthy diet  Ripken Rekowski, NP 03/04/2018, 10:19 AM

## 2018-03-04 NOTE — ED Notes (Signed)
Pt states he doesn't want to be here and was told that he would be IVC'd by Dr. Blinda LeatherwoodPollina if he wasn't going to stay.  Pt was then IVC'd by Dr. Blinda LeatherwoodPollina.  Pt's family member was informed that pt was IVC'd.  Family member went back to the room and shortly after, pt was seen walking out of the hospital.  Pt was told that he can't leave.  Pt had IV still in place and asked pt if IV can be taken out.  Pt refused and continued to walk out of the hospital.  Security was called.

## 2018-03-04 NOTE — [facility] Counselor (Signed)
Dr. Sharma CovertNorman and [name] Lord, NP evaluated patient on this day. Patient does not meet criteria for inpatient treatment. Counselor [facility] for outpatient services.

## 2018-03-11 DIAGNOSIS — F902 Attention-deficit hyperactivity disorder, combined type: Secondary | ICD-10-CM | POA: Diagnosis not present

## 2018-03-14 ENCOUNTER — Encounter: Payer: Self-pay | Admitting: Urgent Care

## 2018-03-14 ENCOUNTER — Ambulatory Visit: Payer: BLUE CROSS/BLUE SHIELD | Admitting: Urgent Care

## 2018-03-14 VITALS — BP 118/78 | HR 88 | Temp 98.3°F | Resp 16 | Ht 70.0 in | Wt 179.0 lb

## 2018-03-14 DIAGNOSIS — R0981 Nasal congestion: Secondary | ICD-10-CM

## 2018-03-14 DIAGNOSIS — J209 Acute bronchitis, unspecified: Secondary | ICD-10-CM

## 2018-03-14 DIAGNOSIS — R0602 Shortness of breath: Secondary | ICD-10-CM | POA: Diagnosis not present

## 2018-03-14 DIAGNOSIS — R0789 Other chest pain: Secondary | ICD-10-CM | POA: Diagnosis not present

## 2018-03-14 DIAGNOSIS — Z8709 Personal history of other diseases of the respiratory system: Secondary | ICD-10-CM | POA: Diagnosis not present

## 2018-03-14 DIAGNOSIS — F17200 Nicotine dependence, unspecified, uncomplicated: Secondary | ICD-10-CM | POA: Diagnosis not present

## 2018-03-14 DIAGNOSIS — R0982 Postnasal drip: Secondary | ICD-10-CM | POA: Diagnosis not present

## 2018-03-14 DIAGNOSIS — R059 Cough, unspecified: Secondary | ICD-10-CM

## 2018-03-14 DIAGNOSIS — R05 Cough: Secondary | ICD-10-CM | POA: Diagnosis not present

## 2018-03-14 MED ORDER — HYDROCODONE-HOMATROPINE 5-1.5 MG/5ML PO SYRP
5.0000 mL | ORAL_SOLUTION | Freq: Every evening | ORAL | 0 refills | Status: AC | PRN
Start: 2018-03-14 — End: ?

## 2018-03-14 MED ORDER — ALBUTEROL SULFATE HFA 108 (90 BASE) MCG/ACT IN AERS: 2.0000 | INHALATION_SPRAY | Freq: Four times a day (QID) | RESPIRATORY_TRACT | 1 refills | Status: AC | PRN

## 2018-03-14 MED ORDER — PSEUDOEPHEDRINE HCL ER 120 MG PO TB12: 120.0000 mg | ORAL_TABLET | Freq: Two times a day (BID) | ORAL | 3 refills | Status: AC

## 2018-03-14 MED ORDER — AZITHROMYCIN 250 MG PO TABS: ORAL_TABLET | ORAL | 0 refills | Status: AC

## 2018-03-14 MED ORDER — BENZONATATE 100 MG PO CAPS: 100.0000 mg | ORAL_CAPSULE | Freq: Three times a day (TID) | ORAL | 0 refills | Status: AC | PRN

## 2018-03-14 MED ORDER — CETIRIZINE HCL 10 MG PO TABS: 10.0000 mg | ORAL_TABLET | Freq: Every day | ORAL | 11 refills | Status: AC

## 2018-03-14 MED ORDER — PREDNISONE 10 MG PO TABS: 40.0000 mg | ORAL_TABLET | Freq: Every day | ORAL | 0 refills | Status: AC

## 2018-03-14 NOTE — Progress Notes (Signed)
   MRN: [id number] DOB: 06/07/1991  Subjective:   Corey Allison is a 27 y.o. male presenting for 1 week history of productive cough, shob, wheezing. Cough is eliciting chest pain, throat pain, dizziness, headaches. Has had coughing fits and feels an upset stomach belly pain from the same. Also reports nasal congestion, difficulty breathing through his nose. Has tried Mucinex, Robitussin. Denies fever, sinus pain, ear pain, rashes. Smokes 1/4ppd, states that smoking helps his breathing daily.  Reports a history of childhood asthma, has used albuterol very successfully in the past.  Corey Allison has a current medication list which includes the following prescription(s): amphetamine-dextroamphetamine, fluvoxamine, and multiple vitamins-minerals. Also has No Known Allergies.  Corey Allison  has a past medical history of ADD (attention deficit disorder), Anxiety, Depression, Headache(784.0), OCD (obsessive compulsive disorder), and Perirectal abscess. Also  has a past surgical history that includes Wisdom tooth extraction. His family history includes Celiac disease in his father; Heart disease in his father; Kidney disease in his maternal grandfather.   Objective:   Vitals: BP 118/78   Pulse 88   Temp 98.3 F (36.8 C) (Oral)   Resp 16   Ht 5\' 10"  (1.778 m)   Wt 179 lb (81.2 kg)   SpO2 98%   BMI 25.68 kg/m   Physical Exam  Constitutional: He is oriented to person, place, and time. He appears well-developed and well-nourished.  HENT:  Nasal mucosa is erythematous and swollen, nasal passages minimally patent.  Throat with significant streaks of postnasal drainage.  No sinus tenderness.  Eyes: Right eye exhibits no discharge. Left eye exhibits no discharge. No scleral icterus.  Neck: Normal range of motion. Neck supple.  Cardiovascular: Normal rate, regular rhythm and intact distal pulses. Exam reveals no gallop and no friction rub.  No murmur heard. Pulmonary/Chest: Effort normal. No stridor. No  respiratory distress. He has no wheezes. He has no rales.  Coarse lung sounds over right lower base.  Lymphadenopathy:    He has no cervical adenopathy.  Neurological: He is alert and oriented to person, place, and time.  Skin: Skin is warm and dry.  Psychiatric: He has a normal mood and affect.   Assessment and Plan :   Acute bronchitis, unspecified organism  Cough  Atypical chest pain  Shortness of breath  Sinus congestion  Post-nasal drainage  History of asthma  Tobacco use disorder  Will manage for an acute bronchitis with azithromycin and a short steroid course given his smoking.  Provided with supportive care including an albuterol inhaler, cough suppression medications of Hycodan and Tessalon Perles.  Recommended patient also use Zyrtec and Sudafed over-the-counter.  Counseled patient extensively on smoking cessation. Counseled patient on potential for adverse effects with medications prescribed today, patient verbalized understanding. Return-to-clinic precautions discussed, patient verbalized understanding.   Wallis BambergMario Evadean Sproule, PA-C [facility] [phone] 03/14/2018  8:23 AM

## 2018-03-14 NOTE — Patient Instructions (Addendum)
Acute Bronchitis, Adult Acute bronchitis is sudden (acute) swelling of the air tubes (bronchi) in the lungs. Acute bronchitis causes these tubes to fill with mucus, which can make it hard to breathe. It can also cause coughing or wheezing. In adults, acute bronchitis usually goes away within 2 weeks. A cough caused by bronchitis may last up to 3 weeks. Smoking, allergies, and asthma can make the condition worse. Repeated episodes of bronchitis may cause further lung problems, such as chronic obstructive pulmonary disease (COPD). What are the causes? This condition can be caused by germs and by substances that irritate the lungs, including:  Cold and flu viruses. This condition is most often caused by the same virus that causes a cold.  Bacteria.  Exposure to tobacco smoke, dust, fumes, and air pollution.  What increases the risk? This condition is more likely to develop in people who:  Have close contact with someone with acute bronchitis.  Are exposed to lung irritants, such as tobacco smoke, dust, fumes, and vapors.  Have a weak immune system.  Have a respiratory condition such as asthma.  What are the signs or symptoms? Symptoms of this condition include:  A cough.  Coughing up clear, yellow, or green mucus.  Wheezing.  Chest congestion.  Shortness of breath.  A fever.  Body aches.  Chills.  A sore throat.  How is this diagnosed? This condition is usually diagnosed with a physical exam. During the exam, your health care provider may order tests, such as chest X-rays, to rule out other conditions. He or she may also:  Test a sample of your mucus for bacterial infection.  Check the level of oxygen in your blood. This is done to check for pneumonia.  Do a chest X-ray or lung function testing to rule out pneumonia and other conditions.  Perform blood tests.  Your health care provider will also ask about your symptoms and medical history. How is this  treated? Most cases of acute bronchitis clear up over time without treatment. Your health care provider may recommend:  Drinking more fluids. Drinking more makes your mucus thinner, which may make it easier to breathe.  Taking a medicine for a fever or cough.  Taking an antibiotic medicine.  Using an inhaler to help improve shortness of breath and to control a cough.  Using a cool mist vaporizer or humidifier to make it easier to breathe.  Follow these instructions at home: Medicines  Take over-the-counter and prescription medicines only as told by your health care provider.  If you were prescribed an antibiotic, take it as told by your health care provider. Do not stop taking the antibiotic even if you start to feel better. General instructions  Get plenty of rest.  Drink enough fluids to keep your urine clear or pale yellow.  Avoid smoking and secondhand smoke. Exposure to cigarette smoke or irritating chemicals will make bronchitis worse. If you smoke and you need help quitting, ask your health care provider. Quitting smoking will help your lungs heal faster.  Use an inhaler, cool mist vaporizer, or humidifier as told by your health care provider.  Keep all follow-up visits as told by your health care provider. This is important. How is this prevented? To lower your risk of getting this condition again:  Wash your hands often with soap and water. If soap and water are not available, use hand sanitizer.  Avoid contact with people who have cold symptoms.  Try not to touch your hands to your   mouth, nose, or eyes.  Make sure to get the flu shot every year.  Contact a health care provider if:  Your symptoms do not improve in 2 weeks of treatment. Get help right away if:  You cough up blood.  You have chest pain.  You have severe shortness of breath.  You become dehydrated.  You faint or keep feeling like you are going to faint.  You keep vomiting.  You have a  severe headache.  Your fever or chills gets worse. This information is not intended to replace advice given to you by your health care provider. Make sure you discuss any questions you have with your health care provider. Document Released: 10/26/2004 Document Revised: 04/12/2016 Document Reviewed: 03/08/2016 Elsevier Interactive Patient Education  2018 [employer]      IF you received an x-ray today, you will receive an invoice from [facility]. Please contact [facility] at [phone] with questions or concerns regarding your invoice.   IF you received labwork today, you will receive an invoice from [facility]. Please contact [facility] at [phone] with questions or concerns regarding your invoice.   Our billing staff will not be able to assist you with questions regarding bills from these companies.  You will be contacted with the lab results as soon as they are available. The fastest way to get your results is to activate your My Chart account. Instructions are located on the last page of this paperwork. If you have not heard from us regarding the results in 2 weeks, please contact this office.      

## 2018-03-21 DIAGNOSIS — F902 Attention-deficit hyperactivity disorder, combined type: Secondary | ICD-10-CM | POA: Diagnosis not present

## 2018-04-10 DIAGNOSIS — F902 Attention-deficit hyperactivity disorder, combined type: Secondary | ICD-10-CM | POA: Diagnosis not present

## 2018-04-17 DIAGNOSIS — F902 Attention-deficit hyperactivity disorder, combined type: Secondary | ICD-10-CM | POA: Diagnosis not present

## 2018-04-23 DIAGNOSIS — F902 Attention-deficit hyperactivity disorder, combined type: Secondary | ICD-10-CM | POA: Diagnosis not present

## 2018-05-01 DIAGNOSIS — F902 Attention-deficit hyperactivity disorder, combined type: Secondary | ICD-10-CM | POA: Diagnosis not present

## 2018-08-05 ENCOUNTER — Telehealth: Payer: Self-pay | Admitting: Psychiatry

## 2018-08-05 ENCOUNTER — Ambulatory Visit: Payer: Self-pay | Admitting: Psychiatry

## 2018-08-05 ENCOUNTER — Other Ambulatory Visit: Payer: Self-pay | Admitting: Psychiatry

## 2018-08-05 DIAGNOSIS — F902 Attention-deficit hyperactivity disorder, combined type: Secondary | ICD-10-CM

## 2018-08-05 DIAGNOSIS — F429 Obsessive-compulsive disorder, unspecified: Secondary | ICD-10-CM | POA: Insufficient documentation

## 2018-08-05 DIAGNOSIS — F3342 Major depressive disorder, recurrent, in full remission: Secondary | ICD-10-CM | POA: Insufficient documentation

## 2018-08-05 MED ORDER — AMPHETAMINE-DEXTROAMPHETAMINE 20 MG PO TABS
20.0000 mg | ORAL_TABLET | Freq: Two times a day (BID) | ORAL | 0 refills | Status: DC
Start: 2018-08-05 — End: 2018-08-07

## 2018-08-05 NOTE — Telephone Encounter (Signed)
Patient has mother come to the office not answering his phone and voicemail is full since prior authorization of his Adderall required change from the 10 to the 20 mg tablet for coverage.  Nursing and myself have been unable to reach the patient and he did not arrive for his 1420 appointment today in the office, mother stating that he was told the appointment was on Tuesday rather than Monday.  Therapist asserts that the patient was making good progress after his overdose with 7 Luvox in June managed in the emergency department.  However the patient has not been attending appointments since August with therapist as he separating further from wife who is not reuniting even though he wanted the marriage to reunite.  I explained to mother the change in the office to Epic and the prior authorization problems with Adderall strength so she can explain that to patient who in his OCD has difficulty with change.  Mother prefers the Adderall to go to [facility] [place] it up for him, as 20 mg IR twice daily and he can divide into the 10 mg regimen he had for morning, noon and 1600 taking 2 of the 10 mg in the morning with quantity #60 medically necessary with no contraindication sent to [facility] at [address].

## 2018-08-07 ENCOUNTER — Encounter: Payer: Self-pay | Admitting: Psychiatry

## 2018-08-07 ENCOUNTER — Ambulatory Visit (INDEPENDENT_AMBULATORY_CARE_PROVIDER_SITE_OTHER): Payer: BLUE CROSS/BLUE SHIELD | Admitting: Psychiatry

## 2018-08-07 VITALS — BP 118/78 | HR 76 | Ht 69.0 in | Wt 173.0 lb

## 2018-08-07 DIAGNOSIS — F902 Attention-deficit hyperactivity disorder, combined type: Secondary | ICD-10-CM | POA: Diagnosis not present

## 2018-08-07 DIAGNOSIS — F422 Mixed obsessional thoughts and acts: Secondary | ICD-10-CM

## 2018-08-07 DIAGNOSIS — Z635 Disruption of family by separation and divorce: Secondary | ICD-10-CM

## 2018-08-07 DIAGNOSIS — F3342 Major depressive disorder, recurrent, in full remission: Secondary | ICD-10-CM

## 2018-08-07 MED ORDER — FLUVOXAMINE MALEATE 100 MG PO TABS: 100.0000 mg | ORAL_TABLET | Freq: Every day | ORAL | 5 refills | Status: DC

## 2018-08-07 MED ORDER — AMPHETAMINE-DEXTROAMPHETAMINE 20 MG PO TABS: 20.0000 mg | ORAL_TABLET | Freq: Two times a day (BID) | ORAL | 0 refills | Status: DC

## 2018-08-07 NOTE — Progress Notes (Signed)
[facility]  Patient ID: Corey Allison,  MRN: [id number]  PCP: [facility] [place]  Date of Evaluation: 08/07/2018 Time spent:20 minutes  Chief Complaint:  Chief Complaint    ADHD; Anxiety      HISTORY/CURRENT STATUS: Mannix is seen individually face-to-face with consent with collateral of mother arriving yesterday to obtain his Adderall for psychiatric interview and exam in 35-month evaluation and management of ADHD/OCD and history of major depression.  He is nearly 2 months overdue for follow-up agreed-upon but has been in therapy with Elio Forget, [facility] into August after intoxication with alcohol and cannabis ingesting several Luvox 100 mg he states was misunderstood to represent 700 mg in the ED. Addressing the depressing evolving divorce with wife, the patient had expected they would reunite.  His weight loss has been associated with absence of wife's cooking as well as less [employer] and more healthy diet.  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  Mother asserts that he is doing better as does therapist, and patient reviews all these issues in the session.  He requests to continue his Luvox stating he sometimes forgets whether he is taking it at night he often uses cannabis at night no longer alcohol.  He has plan to obtain a weekly dosing dispenser for this purpose.  He complains that 200 mg of Luvox or no Luvox at night make for a difficult next day.  Anxiety  Presents for follow-up visit. Symptoms include compulsions, decreased concentration, excessive worry, insomnia, nervous/anxious behavior and obsessions. Patient reports no chest pain, confusion, depressed mood, dizziness, dry mouth, feeling of choking, hyperventilation, impotence, irritability, malaise, muscle tension, nausea, palpitations, panic,  restlessness, shortness of breath or suicidal ideas. Symptoms occur most days. The most recent episode lasted 3 hours. The severity of symptoms is interfering with daily activities. The patient sleeps 5 hours per night. The quality of sleep is fair. Nighttime awakenings: occasional.   Compliance with medications is 51-75%.    Individual Medical History/ Review of Systems: Changes? :Yes  .  We review the emergency department interventions for his intoxication more alcohol and cannabis than Luvox in early summer wife protracting the assessment there.  He his previous treatment with Ritalin, Concerta, Vayarain, Celexa, Strattera, Xanax, Ambien, Wellbutrin and Inderal.  Allergies: Patient has no known allergies.  Current Medications:  Current Outpatient Medications:  .  albuterol (PROVENTIL HFA;VENTOLIN HFA) 108 (90 Base) MCG/ACT inhaler, Inhale 2 puffs into the lungs every 6 (six) hours as needed., Disp: 1 Inhaler, Rfl: 1 .  [START ON 09/04/2018] amphetamine-dextroamphetamine (ADDERALL) 20 MG tablet, Take 1 tablet (20 mg total) by mouth 2 (two) times daily., Disp: 60 tablet, Rfl: 0 .  [START ON 10/04/2018] amphetamine-dextroamphetamine (ADDERALL) 20 MG tablet, Take 1 tablet (20 mg total) by mouth 2 (two) times daily., Disp: 60 tablet, Rfl: 0 .  azithromycin (ZITHROMAX) 250 MG tablet, Start with 2 tablets today, then 1 daily thereafter., Disp: 6 tablet, Rfl: 0 .  benzonatate (TESSALON) 100 MG capsule, Take 1-2 capsules (100-200 mg total) by mouth 3 (three) times daily as needed., Disp: 60 capsule, Rfl: 0 .  cetirizine (ZYRTEC) 10 MG tablet, Take 1 tablet (10 mg total) by mouth daily., Disp: 30 tablet, Rfl: 11 .  fluvoxaMINE (LUVOX) 100 MG tablet, Take 1 tablet (100 mg total) by mouth at bedtime.,  Disp: 30 tablet, Rfl: 5 .  HYDROcodone-homatropine (HYCODAN) 5-1.5 MG/5ML syrup, Take 5 mLs by mouth at bedtime as needed., Disp: 100 mL, Rfl: 0 .  Multiple Vitamins-Minerals (MULTIVITAMIN GUMMIES ADULT PO),  Take 1 each by mouth daily., Disp: , Rfl:  .  predniSONE (DELTASONE) 10 MG tablet, Take 4 tablets (40 mg total) by mouth daily with breakfast., Disp: 20 tablet, Rfl: 0 .  pseudoephedrine (SUDAFED 12 HOUR) 120 MG 12 hr tablet, Take 1 tablet (120 mg total) by mouth 2 (two) times daily., Disp: 30 tablet, Rfl: 3 Medication Side Effects: He had some increased anxiety and insomnia and his change over from 10 to 20 mg tablet strengths of Adderall has he attempts to reestablish symptom treatment matching for these formulations.  Family Medical/ Social History: Changes? Yes, mother attends the office for the first time being a support for him in the community especially as he is now divorcing with wife, mother having dysthymia.  MENTAL HEALTH EXAM:  Blood pressure 118/78, pulse 76, height 5\' 9"  (1.753 m), weight 173 lb (78.5 kg).Body mass index is 25.55 kg/m.  General Appearance: Casual, Guarded and Well Groomed  Eye Contact:  Good  Speech:  Clear and Coherent, Normal Rate and Talkative  Volume:  Normal  Mood:  Anxious, Euthymic and Hopeless for the marriage but coping  Affect:  Full Range and Anxious  Thought Process:  Goal Directed and Linear  Orientation:  Full (Time, Place, and Person)  Thought Content: Obsessions and Rumination   Suicidal Thoughts:  No  Homicidal Thoughts:  No  Memory:  Recent;   Fair  Judgement:  Good  Insight:  Fair  Psychomotor Activity:  Increased  Concentration:  Concentration: Fair and Attention Span: Fair  Recall:  Good  Fund of Knowledge: Good  Language: Good  Assets:  Desire for Improvement Resilience Talents/Skills  ADL's:  Intact  Cognition: WNL  Prognosis:  Good    DIAGNOSES:    ICD-10-CM   1. Mixed obsessional thoughts and acts F42.2 fluvoxaMINE (LUVOX) 100 MG tablet  2. Attention deficit hyperactivity disorder (ADHD), combined type, moderate F90.2 amphetamine-dextroamphetamine (ADDERALL) 20 MG tablet  3. Major depression, recurrent, full remission  (HCC) F33.42 fluvoxaMINE (LUVOX) 100 MG tablet  4. Marital problem involving divorce Z63.5 amphetamine-dextroamphetamine (ADDERALL) 20 MG tablet    Receiving Psychotherapy: Yes   Elio Forget, [facility] last appointment in August as though patient disengaging.  RECOMMENDATIONS: All issues are reviewed relative to safety, prevention, and monitoring securing full assurance.  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  He will limit cannabis and utilize a plastic pill dosing weekly container in order to assure proper dosing of the 100 mg nightly Luvox sent as a month supply and 5 refills to [facility] on [address] in [place].  His Adderall 20 mg IR twice daily is also sent as a month supply for December and January to [facility] as he attempts to match dosing intervals and amounts to symptoms as he had for the 10 mg tab which [employer] refuses to cover any longer.  He returns in 6 months or sooner if needed.  Coping with marital dissolution and new responsibilities can be processed to his difficulty with change having OCD.   Chauncey Mann, MD

## 2018-10-24 DIAGNOSIS — R05 Cough: Secondary | ICD-10-CM | POA: Diagnosis not present

## 2018-12-09 DIAGNOSIS — K610 Anal abscess: Secondary | ICD-10-CM | POA: Diagnosis not present

## 2019-01-01 DIAGNOSIS — K610 Anal abscess: Secondary | ICD-10-CM | POA: Diagnosis not present

## 2019-01-01 DIAGNOSIS — N50819 Testicular pain, unspecified: Secondary | ICD-10-CM | POA: Diagnosis not present

## 2019-01-01 DIAGNOSIS — F902 Attention-deficit hyperactivity disorder, combined type: Secondary | ICD-10-CM | POA: Diagnosis not present

## 2019-01-01 DIAGNOSIS — F17200 Nicotine dependence, unspecified, uncomplicated: Secondary | ICD-10-CM | POA: Diagnosis not present

## 2019-01-02 ENCOUNTER — Telehealth: Payer: Self-pay | Admitting: Psychiatry

## 2019-01-02 DIAGNOSIS — Z635 Disruption of family by separation and divorce: Secondary | ICD-10-CM

## 2019-01-02 MED ORDER — AMPHETAMINE-DEXTROAMPHETAMINE 20 MG PO TABS: 20.0000 mg | ORAL_TABLET | Freq: Two times a day (BID) | ORAL | 0 refills | Status: DC

## 2019-01-02 NOTE — Telephone Encounter (Signed)
Last seen in office 08/07/2018 continuing Adderall 20 mg IR twice daily sending #60 with no refill to [facility] on [address] at [place] medically necessary with no contraindication for interim to next appointment 01/23/2019.

## 2019-01-02 NOTE — Telephone Encounter (Signed)
Patient called and said that he needs a script of adderrall 20 mg escribed to the [facility] on [address]. His next appt is 4/23

## 2019-01-23 ENCOUNTER — Ambulatory Visit (INDEPENDENT_AMBULATORY_CARE_PROVIDER_SITE_OTHER): Payer: BLUE CROSS/BLUE SHIELD | Admitting: Psychiatry

## 2019-01-23 ENCOUNTER — Encounter: Payer: Self-pay | Admitting: Psychiatry

## 2019-01-23 ENCOUNTER — Other Ambulatory Visit: Payer: Self-pay

## 2019-01-23 DIAGNOSIS — Z630 Problems in relationship with spouse or partner: Secondary | ICD-10-CM | POA: Insufficient documentation

## 2019-01-23 DIAGNOSIS — F3342 Major depressive disorder, recurrent, in full remission: Secondary | ICD-10-CM | POA: Diagnosis not present

## 2019-01-23 DIAGNOSIS — F902 Attention-deficit hyperactivity disorder, combined type: Secondary | ICD-10-CM | POA: Diagnosis not present

## 2019-01-23 DIAGNOSIS — F422 Mixed obsessional thoughts and acts: Secondary | ICD-10-CM

## 2019-01-23 DIAGNOSIS — F17200 Nicotine dependence, unspecified, uncomplicated: Secondary | ICD-10-CM | POA: Insufficient documentation

## 2019-01-23 DIAGNOSIS — Z635 Disruption of family by separation and divorce: Secondary | ICD-10-CM

## 2019-01-23 MED ORDER — AMPHETAMINE-DEXTROAMPHETAMINE 20 MG PO TABS: 20.0000 mg | ORAL_TABLET | Freq: Two times a day (BID) | ORAL | 0 refills | Status: DC

## 2019-01-23 MED ORDER — FLUVOXAMINE MALEATE 100 MG PO TABS: 100.0000 mg | ORAL_TABLET | Freq: Every day | ORAL | 5 refills | Status: DC

## 2019-01-23 NOTE — Progress Notes (Signed)
[facility]  Patient ID: Corey Allison,  MRN: [id number]  PCP: Darrin Nipperollege, [facility] [place]  Date of Evaluation: 01/23/2019 Time spent:20 minutes from 0925 to 0945  I connected with patient by a video enabled telemedicine application or telephone, with their informed consent, and verified patient privacy and that I am speaking with the correct person using two identifiers.  [facility] and patient dividual he at home residence still separated from wife.  Chief Complaint:  Chief Complaint    Anxiety; ADHD; Depression      HISTORY/CURRENT STATUS: Corey Allison is provided telemedicine appointment session, as he utilizes audio but declines video component at [phone] with OCD not yet ready for the day, with consent not collateral for psychiatric interview and exam in 3852-month evaluation and management of OCD/ADHD, history of major depression, and marital and tobacco use comorbidities.  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  He last had therapy session with Elio ForgetChris Andrews 05/02/2018 to reschedule and resume.  He is ambivalent about his excellent compliance with Luvox 100 mg nightly but noting that his life is going much better overall but not necessarily from the medication.  He has also been consistent in his Adderall taking three quarters of a 20 mg IR tablet at 0800 and 1400 currently, [facility] [address] but adding that they recently gave him a new generic of Luvox that caused him to feel excessively sleepy at night and in the day.  He is obtaining a new PCP at [facility] as his doctor is retiring so we are provided consent already to send records.  He stopped vaping but has resumed cigarettes with no other significant substance use.  [place] registry shows  appropriate fills for Adderall with last on 01/02/2019 #60.  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 He and wife are still separated but not decided about divorce though the courts are currently closed with coronavirus.  He is faithful to the wife in their relationship until marriage is otherwise over.  He has no mania, suicidality, psychosis, or dissociation.  He does manifest improvement in his compliance and successful outcome of therapeutic endeavors though suggesting these are from hard work on his part more than any other specific element of treatment causing success. Depression       The patient presents with depression.  This is a recurrent problem.  The current episode started more than 1 month ago.   The onset quality is sudden.   The problem occurs intermittently.  The problem has been resolved since onset.  Associated symptoms include decreased concentration and decreased interest.  Associated symptoms include no fatigue, no helplessness, no hopelessness, does not have insomnia, not irritable, no headaches, not sad and no suicidal ideas.     The symptoms are aggravated by social issues, work stress and family issues.  Past treatments include SSRIs - Selective serotonin reuptake inhibitors, other medications and psychotherapy.  Compliance with treatment is variable and good.  Past compliance problems include difficulty with treatment plan and medication issues.  Previous treatment provided moderate relief.  Risk factors include marital problems, major life event, stress, history of mental illness and family  history of mental illness.   Past medical history includes anxiety, depression, mental health disorder and obsessive-compulsive disorder.     Pertinent negatives include no life-threatening condition, no physical disability, no recent psychiatric admission, no bipolar disorder, no eating disorder, no  post-traumatic stress disorder, no schizophrenia, no suicide attempts and no head trauma.   Individual Medical History/ Review of Systems: Changes? :No   Allergies: Patient has no known allergies.  Current Medications:  Current Outpatient Medications:  .  albuterol (PROVENTIL HFA;VENTOLIN HFA) 108 (90 Base) MCG/ACT inhaler, Inhale 2 puffs into the lungs every 6 (six) hours as needed., Disp: 1 Inhaler, Rfl: 1 .  [START ON 02/01/2019] amphetamine-dextroamphetamine (ADDERALL) 20 MG tablet, Take 1 tablet (20 mg total) by mouth 2 (two) times daily for 30 days., Disp: 60 tablet, Rfl: 0 .  [START ON 03/03/2019] amphetamine-dextroamphetamine (ADDERALL) 20 MG tablet, Take 1 tablet (20 mg total) by mouth 2 (two) times daily for 30 days., Disp: 60 tablet, Rfl: 0 .  [START ON 04/02/2019] amphetamine-dextroamphetamine (ADDERALL) 20 MG tablet, Take 1 tablet (20 mg total) by mouth 2 (two) times daily for 30 days., Disp: 60 tablet, Rfl: 0 .  azithromycin (ZITHROMAX) 250 MG tablet, Start with 2 tablets today, then 1 daily thereafter., Disp: 6 tablet, Rfl: 0 .  benzonatate (TESSALON) 100 MG capsule, Take 1-2 capsules (100-200 mg total) by mouth 3 (three) times daily as needed., Disp: 60 capsule, Rfl: 0 .  cetirizine (ZYRTEC) 10 MG tablet, Take 1 tablet (10 mg total) by mouth daily., Disp: 30 tablet, Rfl: 11 .  fluvoxaMINE (LUVOX) 100 MG tablet, Take 1 tablet (100 mg total) by mouth at bedtime., Disp: 30 tablet, Rfl: 5 .  HYDROcodone-homatropine (HYCODAN) 5-1.5 MG/5ML syrup, Take 5 mLs by mouth at bedtime as needed., Disp: 100 mL, Rfl: 0 .  Multiple Vitamins-Minerals (MULTIVITAMIN GUMMIES ADULT PO), Take 1 each by mouth daily., Disp: , Rfl:  .  predniSONE (DELTASONE) 10 MG tablet, Take 4 tablets (40 mg total) by mouth daily with breakfast., Disp: 20 tablet, Rfl: 0 .  pseudoephedrine (SUDAFED 12 HOUR) 120 MG 12 hr tablet, Take 1 tablet (120 mg total) by mouth 2 (two) times daily., Disp: 30 tablet, Rfl: 3   Medication  Side Effects: none  Family Medical/ Social History: Changes? Yes he stopped vaping but started smoking again declining any assistance stopping.  Similarly marriage duration is extended and on hold both being slow about making any change or resolution.  MENTAL HEALTH EXAM:  There were no vitals taken for this visit.There is no height or weight on file to calculate BMI.  General Appearance: N/A  Eye Contact:  N/A  Speech:  Clear and Coherent, Normal Rate and Talkative  Volume:  Normal  Mood:  Anxious and Euthymic  Affect:  Full Range and Anxious  Thought Process:  Goal Directed, Irrelevant and Linear  Orientation:  Full (Time, Place, and Person)  Thought Content: Obsessions and Rumination   Suicidal Thoughts:  No  Homicidal Thoughts:  No  Memory:  Immediate;   Good Remote;   Good  Judgement:  Fair  Insight:  Fair  Psychomotor Activity:  Normal, Increased and Mannerisms  Concentration:  Concentration: Fair and Attention Span: Fair  Recall:  Good  Fund of Knowledge: Good  Language: Good  Assets:  Resilience Talents/Skills Vocational/Educational  ADL's:  Intact  Cognition: WNL  Prognosis:  Good    DIAGNOSES:    ICD-10-CM   1. Mixed obsessional thoughts and acts F42.2 fluvoxaMINE (LUVOX) 100  MG tablet  2. Attention deficit hyperactivity disorder (ADHD), combined type, moderate F90.2 amphetamine-dextroamphetamine (ADDERALL) 20 MG tablet    amphetamine-dextroamphetamine (ADDERALL) 20 MG tablet    amphetamine-dextroamphetamine (ADDERALL) 20 MG tablet  3. Major depression, recurrent, full remission (HCC) F33.42 fluvoxaMINE (LUVOX) 100 MG tablet  4. Marital problem Z63.0   5. Tobacco use disorder, moderate, dependence F17.200   6. Marital problem involving divorce Z63.5     Receiving Psychotherapy: Yes  To resume with Elio Forget, [facility]   RECOMMENDATIONS: We update to integrate all possible ways of problem identification and resolving currently available are underway.  The  Luvox is helpful and could be increased but patient is satisfied with 100 mg nightly sent as #30 with 5 refills to [facility] market for OCD and prevention of depression.  Adderall is continued 20 mg IR tablet twice daily for ADHD sent as a 30-day supply each of 60 tablets for 02/01/2019, 03/03/2019 and 04/02/2019 to [facility] on [address].  Records will be forwarded to his new PCP at [facility].  Over 50% of the time is spent in counseling and coordination of care relative to current comorbid conflicts and short and long-term relational and employment issues.  He returns in 6 months.  Virtual Visit via Video Note  I connected with Corey Allison on 01/23/19 at  9:20 AM EDT by a video enabled telemedicine application and verified that I am speaking with the correct person using two identifiers.   I discussed the limitations of evaluation and management by telemedicine and the availability of in person appointments. The patient expressed understanding and agreed to proceed.  History of Present Illness: 29-month evaluation and management of OCD/ADHD, history of major depression, and marital and tobacco use comorbidities.  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  He last had therapy session with Elio Forget 05/02/2018 to reschedule and resume.  He is ambivalent about his excellent compliance with Luvox 100 mg nightly but noting that his life is going much better overall but not necessarily from the medication.  He has also been consistent in his Adderall taking three quarters of a 20 mg IR tablet at 0800 and 1400 currently, preferring the TEVA generic Brand at [facility] on [address] but adding that they recently gave him a new generic of Luvox that caused him to feel excessively sleepy at night and in the day.    Observations/Objective: Mood:   Anxious and Euthymic  Affect:  Full Range and Anxious  Thought Process:  Goal Directed, Irrelevant and Linear   Psychomotor Activity:  Normal, Increased and Mannerisms  Concentration:  Concentration: Fair and Attention Span: Fair  Recall:  Good  Fund of Knowledge: Good    Assessment and Plan: ntegrate all possible ways of problem identification and resolving currently available are underway.  The Luvox is helpful and could be increased but patient is satisfied with 100 mg nightly sent as #30 with 5 refills to [facility] market for OCD and prevention of depression.  Adderall is continued 20 mg IR tablet twice daily for ADHD sent as a 30-day supply each of 60 tablets for 02/01/2019, 03/03/2019 and 04/02/2019 to [facility] on [address].   Follow Up Instructions: Records will be forwarded to his new PCP at [facility].  Over 50% of the time is spent in counseling  and coordination of care relative to current comorbid conflicts and short and long-term relational and employment issues.  He returns in 6 months.    I discussed the assessment and treatment plan with the patient. The patient was provided an opportunity to ask questions and all were answered. The patient agreed with the plan and demonstrated an understanding of the instructions.   The patient was advised to call back or seek an in-person evaluation if the symptoms worsen or if the condition fails to improve as anticipated.  I provided 20 minutes of non-face-to-face time during this encounter. [place] WebEx meeting #[id number] Password: [email or web address]  Chauncey Mann, MD  Chauncey Mann, MD

## 2019-06-20 ENCOUNTER — Telehealth: Payer: Self-pay | Admitting: Psychiatry

## 2019-06-20 DIAGNOSIS — F902 Attention-deficit hyperactivity disorder, combined type: Secondary | ICD-10-CM

## 2019-06-20 MED ORDER — AMPHETAMINE-DEXTROAMPHETAMINE 20 MG PO TABS: 20.0000 mg | ORAL_TABLET | Freq: Two times a day (BID) | ORAL | 0 refills | Status: DC

## 2019-06-20 NOTE — Telephone Encounter (Signed)
Patient again inattentively dissonant from ADHD/OCD for obtaining new fill after exhausting all 3 from last appointment sent as Adderall 20 mg IR twice daily #60 to [place] last fill 05/13/2019 medically necessary no contraindication

## 2019-06-20 NOTE — Telephone Encounter (Signed)
Pt came in to request refill on Adderall. Tried to refill with pharmacy.Pharmacy very confused. Today the last day [facility] has[facility].

## 2019-07-24 ENCOUNTER — Ambulatory Visit (INDEPENDENT_AMBULATORY_CARE_PROVIDER_SITE_OTHER): Payer: BLUE CROSS/BLUE SHIELD | Admitting: Psychiatry

## 2019-07-24 ENCOUNTER — Encounter: Payer: Self-pay | Admitting: Psychiatry

## 2019-07-24 ENCOUNTER — Other Ambulatory Visit: Payer: Self-pay

## 2019-07-24 VITALS — Ht 69.0 in | Wt 177.0 lb

## 2019-07-24 DIAGNOSIS — F3342 Major depressive disorder, recurrent, in full remission: Secondary | ICD-10-CM

## 2019-07-24 DIAGNOSIS — F422 Mixed obsessional thoughts and acts: Secondary | ICD-10-CM

## 2019-07-24 DIAGNOSIS — F902 Attention-deficit hyperactivity disorder, combined type: Secondary | ICD-10-CM

## 2019-07-24 MED ORDER — AMPHETAMINE-DEXTROAMPHETAMINE 20 MG PO TABS: 20.0000 mg | ORAL_TABLET | Freq: Two times a day (BID) | ORAL | 0 refills | Status: DC

## 2019-07-24 MED ORDER — FLUVOXAMINE MALEATE 100 MG PO TABS: 100.0000 mg | ORAL_TABLET | Freq: Every day | ORAL | 5 refills | Status: DC

## 2019-07-24 NOTE — Progress Notes (Signed)
[facility]  Patient ID: Corey Allison,  MRN: [id number]  PCP: Darrin Nipperollege, [facility] [place]  Date of Evaluation: 07/24/2019 Time spent:20 minutes form 0910 to 0930  Chief Complaint:  Chief Complaint    ADHD; Anxiety; Depression      HISTORY/CURRENT STATUS: Corey Allison is seen onsite in office 20 minutes face-to-face with consent with epic collateral individually arriving 10 minutes late for psychiatric interview and exam in 747-month evaluation and management of ADHD/OCD and history of major depression.  Last appointment on telemedicine was after 8 months without therapy with patient not opening up significantly about status of conflicts and relations.  Today he clarifies that he and wife have divorced except only the property settlement pending, as she has been dating another man for the last year and he is content to live a single life at this time.  They have good communication now and mutual respect.  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  He did have post viral bronchitis October 24, 2018 in Urgent Care that gave him hallucinations and severe diaphoresis so that he concludes he must have had Covid though testing was not possible then and delayed, though he saw [facility].  Mother still helps with his obtaining medications from the pharmacy due to his work schedule and living alone.  He is smoking cigarettes again and continues cannabis but has stopped vaping.  He has no mania, suicidality, psychosis, or delirium.  Driving is effective and he has no alteration in mental status since suspected Covid in January.   Depression  Previous medications including prior to treatment here starting 2016 include Celexa, Wellbutrin, Vayarain, Inderal, Ritalin, Concerta, Strattera, Xanax, and Ambien., The patient presents with depression as a recurrent problem with last episode starting more than 3 years  ago.   The onset quality is sudden.   The problem occurs intermittently.  The problem has been resolved since onset.  Associated symptoms include decreased concentration, decreased interest, marginal motivation, negativistic relational compensations.  Associated symptoms include no fatigue, no helplessness or hopelessness, no insomnia, not irritable, no headaches, not sad and no suicidal ideas.     The symptoms are aggravated by social issues, work stress and family issues.  Past treatments include SSRIs - Selective serotonin reuptake inhibitors, other medications and psychotherapy.  Compliance with treatment is variable and good.  Past compliance problems include difficulty with treatment plan and medication issues.  Previous treatment provided moderate relief.  Risk factors include marital problems, major life event, stress, history of mental illness and family history of mental illness.   Past medical history includes anxiety, depression, mental health disorder and obsessive-compulsive disorder. Pertinent negatives include no life-threatening condition, no physical disability, no recent psychiatric admission, no bipolar disorder, no eating disorder, no post-traumatic stress disorder, no schizophrenia, no suicide attempts and no head trauma.  Individual Medical History/ Review of Systems: Changes? :No   Allergies: Patient has no known allergies.  Current Medications:  Current Outpatient Medications:  .  albuterol (PROVENTIL HFA;VENTOLIN HFA) 108 (90 Base) MCG/ACT inhaler, Inhale 2 puffs into the lungs every 6 (six) hours as needed., Disp: 1 Inhaler, Rfl: 1 .  amphetamine-dextroamphetamine (ADDERALL) 20 MG tablet, Take 1 tablet (20 mg total) by mouth 2 (two) times daily., Disp: 60 tablet, Rfl: 0 .  [START ON 08/23/2019] amphetamine-dextroamphetamine (ADDERALL) 20 MG tablet, Take 1 tablet (20 mg total) by mouth 2 (two) times daily., Disp: 60  tablet, Rfl: 0 .  [START ON 09/22/2019]  amphetamine-dextroamphetamine (ADDERALL) 20 MG tablet, Take 1 tablet (20 mg total) by mouth 2 (two) times daily., Disp: 60 tablet, Rfl: 0 .  azithromycin (ZITHROMAX) 250 MG tablet, Start with 2 tablets today, then 1 daily thereafter., Disp: 6 tablet, Rfl: 0 .  benzonatate (TESSALON) 100 MG capsule, Take 1-2 capsules (100-200 mg total) by mouth 3 (three) times daily as needed., Disp: 60 capsule, Rfl: 0 .  cetirizine (ZYRTEC) 10 MG tablet, Take 1 tablet (10 mg total) by mouth daily., Disp: 30 tablet, Rfl: 11 .  fluvoxaMINE (LUVOX) 100 MG tablet, Take 1 tablet (100 mg total) by mouth at bedtime., Disp: 30 tablet, Rfl: 5 .  HYDROcodone-homatropine (HYCODAN) 5-1.5 MG/5ML syrup, Take 5 mLs by mouth at bedtime as needed., Disp: 100 mL, Rfl: 0 .  Multiple Vitamins-Minerals (MULTIVITAMIN GUMMIES ADULT PO), Take 1 each by mouth daily., Disp: , Rfl:  .  predniSONE (DELTASONE) 10 MG tablet, Take 4 tablets (40 mg total) by mouth daily with breakfast., Disp: 20 tablet, Rfl: 0 .  pseudoephedrine (SUDAFED 12 HOUR) 120 MG 12 hr tablet, Take 1 tablet (120 mg total) by mouth 2 (two) times daily., Disp: 30 tablet, Rfl: 3   Medication Side Effects: none  Family Medical/ Social History: Changes? No  MENTAL HEALTH EXAM:  Height 5\' 9"  (1.753 m), weight 177 lb (80.3 kg).Body mass index is 26.14 kg/m. Muscle strengths and tone 5/5, postural reflexes and gait 0/0, and AIMS = 0 otherwise deferred for coronavirus shutdown  General Appearance: Casual, Guarded, Meticulous and Well Groomed  Eye Contact:  Fair  Speech:  Clear and Coherent, Normal Rate and Talkative  Volume:  Normal  Mood:  Negative, Anxious, Dysphoric, Euthymic and Worthless  Affect:  Congruent, Inappropriate, Restricted and Anxious and depressed  Thought Process:  Coherent, Goal Directed, Irrelevant and Descriptions of Associations: Circumstantial  Orientation:  Full (Time, Place, and Person)  Thought Content: Ilusions, Obsessions and Rumination    Suicidal Thoughts:  No  Homicidal Thoughts:  No  Memory:  Immediate;   Good Remote;   Good  Judgement:  Fair  Insight:  Fair  Psychomotor Activity:  Normal, Increased, Mannerisms and Restlessness  Concentration:  Concentration: Fair and Attention Span: Fair  Recall:  [employer] of Knowledge: Good  Language: Good  Assets:  Desire for Improvement Leisure Time Resilience Talents/Skills  ADL's:  Intact  Cognition: WNL  Prognosis:  Fair    DIAGNOSES:    ICD-10-CM   1. Attention deficit hyperactivity disorder (ADHD), combined type, moderate  F90.2 amphetamine-dextroamphetamine (ADDERALL) 20 MG tablet    amphetamine-dextroamphetamine (ADDERALL) 20 MG tablet    amphetamine-dextroamphetamine (ADDERALL) 20 MG tablet  2. Mixed obsessional thoughts and acts  F42.2 fluvoxaMINE (LUVOX) 100 MG tablet  3. Major depression, recurrent, full remission ([place])  F33.42 fluvoxaMINE (LUVOX) 100 MG tablet    Receiving Psychotherapy: No After 2 sessions with Lanetta Inch, [facility] is in July and August 2019, he has not returned despite encouragement.   RECOMMENDATIONS: [place] registry documents last L dispensed 06/20/2019.  He is E scribed today Adderall 20 mg IR tablet twice daily usually taking three quarters of a tablet most doses Sent as #60 each for October 22, November 21, December 21 for ADHD to [facility] at [place].  He is E scribed Luvox 100 mg every bedtime as a month supply and 5 refills for OCD and major depression sent to [facility] [place].  Psychosupportive psychoeducation updates  all conflicts for his trauma and loss as he copes without retaliation with cognitive behavioral exposure habit reversal thought stopping response prevention with safety hygiene, prevention, and monitoring.  He returns in 6 months for follow-up or sooner if needed and grants consent for mother to obtain his prescriptions as needed in his assistance.  Chauncey Mann, MD

## 2019-09-02 DIAGNOSIS — R438 Other disturbances of smell and taste: Secondary | ICD-10-CM | POA: Diagnosis not present

## 2019-09-02 DIAGNOSIS — U071 COVID-19: Secondary | ICD-10-CM | POA: Diagnosis not present

## 2019-10-16 ENCOUNTER — Other Ambulatory Visit: Payer: Self-pay | Admitting: Psychiatry

## 2019-10-16 DIAGNOSIS — F422 Mixed obsessional thoughts and acts: Secondary | ICD-10-CM

## 2019-10-16 DIAGNOSIS — F3342 Major depressive disorder, recurrent, in full remission: Secondary | ICD-10-CM

## 2019-11-07 ENCOUNTER — Other Ambulatory Visit: Payer: Self-pay

## 2019-11-07 ENCOUNTER — Telehealth: Payer: Self-pay | Admitting: Psychiatry

## 2019-11-07 DIAGNOSIS — F902 Attention-deficit hyperactivity disorder, combined type: Secondary | ICD-10-CM

## 2019-11-07 MED ORDER — AMPHETAMINE-DEXTROAMPHETAMINE 20 MG PO TABS: 20.0000 mg | ORAL_TABLET | Freq: Two times a day (BID) | ORAL | 0 refills | Status: DC

## 2019-11-07 NOTE — Telephone Encounter (Signed)
Appropriate and necessary including per [facility] Registry for medically necessary refill with no contraindication.

## 2019-11-07 NOTE — Telephone Encounter (Signed)
Last refill 10/04/2019, pended 1 Rx for Dr. Marlyne Beards to submit.  Patient due back in April

## 2019-11-07 NOTE — Telephone Encounter (Signed)
Pt having issues with refills on Adderall  20 mg. Requesting refill at [facility] [address]. No refill in system for Jan., Feb., March. Some how he got refill for January. Now needs Rx sent for following 3 months. Next appt 4/22

## 2019-12-08 ENCOUNTER — Telehealth: Payer: Self-pay | Admitting: Psychiatry

## 2019-12-08 ENCOUNTER — Other Ambulatory Visit: Payer: Self-pay

## 2019-12-08 DIAGNOSIS — F902 Attention-deficit hyperactivity disorder, combined type: Secondary | ICD-10-CM

## 2019-12-08 MED ORDER — AMPHETAMINE-DEXTROAMPHETAMINE 20 MG PO TABS: 20.0000 mg | ORAL_TABLET | Freq: Two times a day (BID) | ORAL | 0 refills | Status: DC

## 2019-12-08 NOTE — Telephone Encounter (Signed)
Last appointment 07/24/2019 with all 3 descriptions from that appointment dispensed along with another on 11/07/2019 now due the next for interim to appointment April 22 medically necessary no contraindications sent as #60 of the Adderall 20 mg IR tablets twice daily to [facility] [address]

## 2019-12-08 NOTE — Telephone Encounter (Signed)
Last refill 11/07/2019, pended for Dr. Marlyne Beards to submit to [facility]

## 2019-12-08 NOTE — Telephone Encounter (Signed)
Pt requesting refill for Adderall 20 mg 2/d @ [facility] [address]. Next appt 4/22

## 2020-01-05 ENCOUNTER — Telehealth: Payer: Self-pay | Admitting: Psychiatry

## 2020-01-05 ENCOUNTER — Other Ambulatory Visit: Payer: Self-pay

## 2020-01-05 DIAGNOSIS — F902 Attention-deficit hyperactivity disorder, combined type: Secondary | ICD-10-CM

## 2020-01-05 MED ORDER — AMPHETAMINE-DEXTROAMPHETAMINE 20 MG PO TABS: 20.0000 mg | ORAL_TABLET | Freq: Two times a day (BID) | ORAL | 0 refills | Status: DC

## 2020-01-05 NOTE — Telephone Encounter (Signed)
Last refill 12/08/2019, pended for Dr. Marlyne Beards to submit Has apt 01/22/2020

## 2020-01-05 NOTE — Telephone Encounter (Signed)
Patient called and said that he needs a refill on his adderall 20 mg to [facility] [address]. He has an appointment on 4/22

## 2020-01-22 ENCOUNTER — Encounter: Payer: Self-pay | Admitting: Psychiatry

## 2020-01-22 ENCOUNTER — Other Ambulatory Visit: Payer: Self-pay

## 2020-01-22 ENCOUNTER — Ambulatory Visit (INDEPENDENT_AMBULATORY_CARE_PROVIDER_SITE_OTHER): Payer: BC Managed Care – PPO | Admitting: Psychiatry

## 2020-01-22 VITALS — Ht 69.0 in | Wt 175.0 lb

## 2020-01-22 DIAGNOSIS — F902 Attention-deficit hyperactivity disorder, combined type: Secondary | ICD-10-CM | POA: Diagnosis not present

## 2020-01-22 DIAGNOSIS — F422 Mixed obsessional thoughts and acts: Secondary | ICD-10-CM

## 2020-01-22 DIAGNOSIS — F3342 Major depressive disorder, recurrent, in full remission: Secondary | ICD-10-CM | POA: Diagnosis not present

## 2020-01-22 MED ORDER — AMPHETAMINE-DEXTROAMPHETAMINE 20 MG PO TABS: 20.0000 mg | ORAL_TABLET | Freq: Two times a day (BID) | ORAL | 0 refills | Status: DC

## 2020-01-22 MED ORDER — FLUVOXAMINE MALEATE 100 MG PO TABS: 100.0000 mg | ORAL_TABLET | Freq: Every day | ORAL | 5 refills | Status: DC

## 2020-01-22 NOTE — Progress Notes (Signed)
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  He did not buy into his Corporation but seems most capable and responsible in his manner of work.  He is similarly more of a role model for his ex-wife who has been living with another woman for 2 years but comes around to talk to the patient more and more as though needing a best friend who she may consider had been a good husband in the past.  The patient's mother remains supportive and lives nearby this office.  He uses cannabis at night and smokes tobacco regularly with modest alcohol unchanged from the past.  He finds dizziness and nausea if he is late on his Luvox therefore taking it every night but agreeing it is his most important medication for stabilizing emotion and behavior while Adderall helps learning and executive function.  [place] registry documents last Adderall dispensing 01/06/2020 still at [facility] he considers slow but nearby.  He no longer has somatoform fixations and doubt for his mental  health care concluding today he prefers to return in 3 months than his previous 6 months of the last year with last Luvox sent as a 83-month supply in January likely to prescribe adequate to get through this year today.  He has no mania, suicidality, psychosis or delirium.    Depression             Previous medications including prior to treatment here starting 2016 include Celexa, Wellbutrin, Vayarain, Inderal, Ritalin, Concerta, Strattera, Xanax, and Ambien., The patient presents withdepression as a recurrentproblem with last episode starting more than 3 years ago. The onset quality is sudden. The problem occurs intermittently.The problem has been resolvedsince onset.Associated symptoms include decreased concentration, decreased interest, insomnia, and irritable Associated symptoms include no fatigue,no helplessness or hopelessness,no marginal motivation, no negativistic relational compensations,no headaches,no sadness,and no suicidal ideas.The symptoms are aggravated by social issues, work stress and family issues.Past treatments include SSRIs - Selective serotonin reuptake inhibitors, other medications and psychotherapy.Compliance with treatment is variable and good.Past compliance problems include difficulty with treatment plan and medication issues.Previous treatment provided moderaterelief.Risk factors include marital problems, major life event, stress, history of mental illness and family history of mental illness. Past medical history includes anxiety,depression,mental health disorderand obsessive-compulsive disorder. Pertinent negatives include no life-threatening condition,no physical disability,no recent psychiatric admission,no bipolar disorder,no eating disorder,no post-traumatic stress disorder,no schizophrenia,no suicide attemptsand no head trauma.  Individual Medical History/ Review of Systems: Changes? :Yes  Weight up 2 pounds and still  smoking  Allergies: Patient has no known allergies.  Current Medications:  Current Outpatient Medications:  .  albuterol (PROVENTIL HFA;VENTOLIN HFA) 108 (90 Base) MCG/ACT inhaler, Inhale 2 puffs into the lungs every 6 (six) hours as needed., Disp: 1 Inhaler, Rfl: 1 .  [START ON 02/05/2020] amphetamine-dextroamphetamine (ADDERALL) 20 MG tablet, Take 1 tablet (20 mg total) by mouth 2 (two) times daily., Disp: 60 tablet, Rfl: 0 .  [START ON 03/06/2020] amphetamine-dextroamphetamine (ADDERALL) 20 MG tablet, Take 1 tablet (20 mg total) by mouth 2 (two) times daily., Disp: 60 tablet, Rfl: 0 .  [START ON 04/05/2020] amphetamine-dextroamphetamine (ADDERALL) 20 MG tablet, Take 1 tablet (20 mg total) by mouth 2 (two) times daily., Disp: 60 tablet, Rfl: 0 .  azithromycin (ZITHROMAX) 250 MG tablet, Start with 2 tablets today, then 1 daily thereafter., Disp: 6 tablet, Rfl: 0 .  benzonatate (TESSALON) 100 MG capsule, Take 1-2 capsules (100-200 mg total) by mouth 3 (three) times daily as needed., Disp: 60 capsule, Rfl: 0 .  cetirizine (ZYRTEC) 10 MG tablet, Take 1 tablet (10 mg total) by mouth daily., Disp: 30 tablet, Rfl: 11 .  fluvoxaMINE (LUVOX) 100 MG tablet, Take 1 tablet (100 mg total) by mouth at bedtime., Disp: 30 tablet, Rfl: 5 .  HYDROcodone-homatropine (HYCODAN) 5-1.5 MG/5ML syrup, Take 5 mLs by mouth at bedtime as needed., Disp: 100 mL, Rfl: 0 .  Multiple Vitamins-Minerals (MULTIVITAMIN GUMMIES ADULT PO), Take 1 each by mouth daily., Disp: , Rfl:  .  predniSONE (DELTASONE) 10 MG tablet, Take 4 tablets (40 mg total) by mouth daily with breakfast., Disp: 20 tablet, Rfl: 0 .  pseudoephedrine (SUDAFED 12 HOUR) 120 MG 12 hr tablet, Take 1 tablet (120 mg total) by mouth 2 (two) times daily., Disp: 30 tablet, Rfl: 3   Medication Side Effects: dizziness/lightheadedness and nausea discontinuation symptoms when misses Luvox  Family Medical/ Social History: Changes? No  MENTAL HEALTH EXAM:  Height 5\' 9"  (1.753  m), weight 175 lb (79.4 kg).Body mass index is 25.84 kg/m. Muscle strengths and tone 5/5, postural reflexes and gait 0/0, and AIMS = 0 otherwise deferred for coronavirus shutdown  General Appearance: Casual, well groomed, meticulous, guarded  Eye Contact:  Good  Speech:  Clear and Coherent, Normal Rate and Talkative  Volume:  Normal  Mood:  Anxious, Euthymic and Irritable  Affect:  Congruent, Inappropriate, Full Range and Anxious  Thought Process:  Coherent, Goal Directed, Irrelevant, Linear and Descriptions of Associations: Circumstantial  Orientation:  Full (Time, Place, and Person)  Thought Content: Obsessions and Rumination   Suicidal Thoughts:  No  Homicidal Thoughts:  No  Memory:  Immediate;   Good Remote;   Good  Judgement:  Good  Insight:  Fair  Psychomotor Activity:  Normal and Mannerisms  Concentration:  Concentration: Good and Attention Span: Fair  Recall:  Good  Fund of Knowledge: Good  Language: Good  Assets:  Intimacy Resilience Vocational/Educational  ADL's:  Intact  Cognition: WNL  Prognosis:  Good    DIAGNOSES:    ICD-10-CM   1. Attention deficit hyperactivity disorder (ADHD), combined type, moderate  F90.2 amphetamine-dextroamphetamine (ADDERALL) 20 MG tablet    amphetamine-dextroamphetamine (ADDERALL) 20 MG tablet    amphetamine-dextroamphetamine (ADDERALL) 20 MG tablet  2. Mixed obsessional thoughts and acts  F42.2 fluvoxaMINE (LUVOX) 100 MG tablet  3. Major depression, recurrent, full remission (HCC)  F33.42 fluvoxaMINE (LUVOX) 100 MG tablet    Receiving Psychotherapy: No    RECOMMENDATIONS: Patient is less defensive than in the past but still guardedly negativistic when thoughtful about theoretical update, though overall more comfortable with his job, his daily  life, and his opportunities.  Symptom treatment matching concludes to continue current medications as helpful as prevention/monitoring, and safety hygiene are reviewed.  Cessation of smoking and  exercise are immediate health initiatives.  He is E scribed Luvox 100 mg every bedtime sent as a month supply and 5 refills to [facility] [address] for OCD and history of depression also sending Adderall 20 mg IR twice daily #60 each for May 6, June 5, and July 5 for ADHD.  He has much more access to therapeutic change than in the past to return in 3 months hopefully to further address such opportunities for therapeutic resolution.   Chauncey Mann, MD

## 2020-04-22 ENCOUNTER — Encounter: Payer: Self-pay | Admitting: Psychiatry

## 2020-04-22 ENCOUNTER — Ambulatory Visit (INDEPENDENT_AMBULATORY_CARE_PROVIDER_SITE_OTHER): Payer: BC Managed Care – PPO | Admitting: Psychiatry

## 2020-04-22 VITALS — Ht 69.0 in | Wt 180.0 lb

## 2020-04-22 DIAGNOSIS — F422 Mixed obsessional thoughts and acts: Secondary | ICD-10-CM | POA: Diagnosis not present

## 2020-04-22 DIAGNOSIS — F902 Attention-deficit hyperactivity disorder, combined type: Secondary | ICD-10-CM

## 2020-04-22 DIAGNOSIS — F3342 Major depressive disorder, recurrent, in full remission: Secondary | ICD-10-CM

## 2020-04-22 DIAGNOSIS — F17200 Nicotine dependence, unspecified, uncomplicated: Secondary | ICD-10-CM | POA: Diagnosis not present

## 2020-04-22 MED ORDER — AMPHETAMINE-DEXTROAMPHETAMINE 20 MG PO TABS: 20.0000 mg | ORAL_TABLET | Freq: Two times a day (BID) | ORAL | 0 refills | Status: DC

## 2020-04-22 NOTE — Progress Notes (Signed)
[facility]  Patient ID: Corey Allison,  MRN: [id number]  PCP: [facility] [place]  Date of Evaluation: 04/22/2020 Time spent:25 minutes from 0900 to 0925  Chief Complaint:  Chief Complaint    ADHD; Anxiety; Stress; Depression      HISTORY/CURRENT STATUS: Corey Allison is seen onsite in office 25 minutes face-to-face arriving on time individually with consent with epic collateral for psychiatric interview and exam in 9-month evaluation and management of ADHD/OCD, history of major depression, and ongoing tobacco though less cannabis.  The patient gradually relates in the session that a business related contact with a [employer] professor possibly psychologist informed him that he may have autism spectrum.  Narcissistic defenses particularly prevalent in the past relative to somatic symptoms have been smoldering since that professor's undue prediction, patient stating he always procrastinates leaving ideas on his desk now discussing today for the pivotal questions.  He describes his life growing up with a single mother still loving in his life who worked multiple jobs then addressing the loss of relationship with his own wife after his unemployment playing video games after the extensive mental health care for multiple somatic concerns at [facility].  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  He is more comfortable and confident with management of the ADHD and OCD now but is no longer seeing Elio Forget in therapy remaining vulnerable to recurrence of depression.  As in the past, he interprets that diagnoses have been missed rather than formulating from the course of his care the dynamic origins to his symptoms he readily describes..  He has  adequate refills of Luvox he continues compliantly and wishes to continue his Adderall.  He has deferred the confrontation by the [employer] professor that he might be autistic on his desk for some time to now asking for options to answer this question more objectively.  He has no mania, suicidality, psychosis or delirium.  Depression Previous medications including prior to treatment here starting 2016 include Celexa, Wellbutrin,Vayarain,Inderal, Ritalin, Concerta, Strattera, Xanax,andAmbien.,The patient presents withdepressionas a recurrentproblemwith lastsevere episode startingmore than 3 yearsago. The onset quality is sudden. The problem occurs intermittently.The symptoms have remittedsince onset.Associated symptoms include decreased concentration,decreased interest, insomnia, and irritable dissatisfaction Associated symptoms include no fatigue,no helplessnessorhopelessness,nomarginal motivation, no negativistic relational decompensations,no headaches,no sadness,and no suicidal ideas.The symptoms are aggravated by social issues, work stress and family issues.Past treatments include SSRIs - Selective serotonin reuptake inhibitors, other medications and psychotherapy.Compliance with treatment is variable and good.Past compliance problems include difficulty with treatment plan and medication issues.Previous treatment provided moderaterelief.Risk factors include marital problems, major life event, stress, history of mental illness and family history of mental illness. Past medical history includes anxiety,depression,mental health disorderand obsessive-compulsive disorder. Pertinent negatives include no life-threatening condition,no physical disability,no recent psychiatric admission,no bipolar disorder,no eating disorder,no post-traumatic stress disorder,no schizophrenia,no suicide attemptsand no head trauma.   Individual Medical History/ Review  of Systems: Changes? :Yes Is 5 pounds after 2 pound gain last appointment still smoking but having few other somatic concerns today.  Allergies: Patient has no known allergies.  Current Medications:  Current Outpatient Medications:  .  albuterol (PROVENTIL HFA;VENTOLIN HFA) 108 (90 Base) MCG/ACT inhaler, Inhale 2 puffs into the lungs every 6 (six) hours as needed., Disp: 1 Inhaler, Rfl:  1 .  [START ON 05/16/2020] amphetamine-dextroamphetamine (ADDERALL) 20 MG tablet, Take 1 tablet (20 mg total) by mouth 2 (two) times daily., Disp: 60 tablet, Rfl: 0 .  [START ON 06/15/2020] amphetamine-dextroamphetamine (ADDERALL) 20 MG tablet, Take 1 tablet (20 mg total) by mouth 2 (two) times daily., Disp: 60 tablet, Rfl: 0 .  [START ON 07/15/2020] amphetamine-dextroamphetamine (ADDERALL) 20 MG tablet, Take 1 tablet (20 mg total) by mouth 2 (two) times daily., Disp: 60 tablet, Rfl: 0 .  azithromycin (ZITHROMAX) 250 MG tablet, Start with 2 tablets today, then 1 daily thereafter., Disp: 6 tablet, Rfl: 0 .  benzonatate (TESSALON) 100 MG capsule, Take 1-2 capsules (100-200 mg total) by mouth 3 (three) times daily as needed., Disp: 60 capsule, Rfl: 0 .  cetirizine (ZYRTEC) 10 MG tablet, Take 1 tablet (10 mg total) by mouth daily., Disp: 30 tablet, Rfl: 11 .  fluvoxaMINE (LUVOX) 100 MG tablet, Take 1 tablet (100 mg total) by mouth at bedtime., Disp: 30 tablet, Rfl: 5 .  HYDROcodone-homatropine (HYCODAN) 5-1.5 MG/5ML syrup, Take 5 mLs by mouth at bedtime as needed., Disp: 100 mL, Rfl: 0 .  Multiple Vitamins-Minerals (MULTIVITAMIN GUMMIES ADULT PO), Take 1 each by mouth daily., Disp: , Rfl:  .  predniSONE (DELTASONE) 10 MG tablet, Take 4 tablets (40 mg total) by mouth daily with breakfast., Disp: 20 tablet, Rfl: 0 .  pseudoephedrine (SUDAFED 12 HOUR) 120 MG 12 hr tablet, Take 1 tablet (120 mg total) by mouth 2 (two) times daily., Disp: 30 tablet, Rfl: 3   Medication Side Effects: none  Family Medical/ Social History:  Changes? No growing up with a single mother still loving in his life who worked multiple jobs followed by the lost relationship with his own wife after his extended entitled unemployment playing video games  MENTAL HEALTH EXAM:  Height 5\' 9"  (1.753 m), weight 180 lb (81.6 kg).Body mass index is 26.58 kg/m. Muscle strengths and tone 5/5, postural reflexes and gait 0/0, and AIMS = 0.  General Appearance: Guarded, Meticulous and Well Groomed  Eye Contact:  Good  Speech:  Clear and Coherent, Normal Rate and Talkative  Volume:  Normal  Mood:  Anxious, Dysphoric and Euphoric  Affect:  Congruent, Inappropriate, Full Range and Anxious  Thought Process:  Coherent, Goal Directed, Irrelevant, Linear and Descriptions of Associations: Circumstantial  Orientation:  Full (Time, Place, and Person)  Thought Content: Obsessions and Rumination   Suicidal Thoughts:  No  Homicidal Thoughts:  No  Memory:  Immediate;   Good Remote;   Good  Judgement:  Good  Insight:  Fair  Psychomotor Activity:  Normal, Increased, Decreased and Mannerisms  Concentration:  Concentration: Good and Attention Span: Fair  Recall:  Good  Fund of Knowledge: Good  Language: Good  Assets:  Desire for Improvement Intimacy Leisure Time Resilience Vocational/Educational  ADL's:  Intact  Cognition: WNL  Prognosis:  Good    DIAGNOSES:    ICD-10-CM   1. Attention deficit hyperactivity disorder (ADHD), combined type, moderate  F90.2 amphetamine-dextroamphetamine (ADDERALL) 20 MG tablet    amphetamine-dextroamphetamine (ADDERALL) 20 MG tablet    amphetamine-dextroamphetamine (ADDERALL) 20 MG tablet  2. Mixed obsessional thoughts and acts  F42.2   3. Major depression, recurrent, full remission (HCC)  F33.42   4. Tobacco use disorder, moderate, dependence  F17.200     Receiving Psychotherapy: No last seeing , [facility] 05/02/2018   RECOMMENDATIONS: Symptom origins and assessment options are discussed as patient will  allow, being provided the possible local  reference of Bryson Dames, PhD for consultation and possible testing if patient so prefers.  Reworking of the course and content of past treatment for current function may facilitate the patient's capacity to resolve these conflicts.  Otherwise returning to therapy with Elio Forget is likely the most complete therapeutic approach.  Patient has refills remaining for Luvox 100 mg every bedtime for the next 3 months for OCD and history of major depression which can be increased, though he is not comfortable now or in the past doing so.  He is E scribed Adderall 20 mg IR tablet twice daily sent as #60 each for August 15, September 14, and October 14 for ADHD with [facility] registry documenting appropriate use in the last year including last dispensing 04/16/2020 as final of 3 eScription's of 01/22/2020.  He returns for follow-up in 3 months or sooner if needed.    Chauncey Mann, MD

## 2020-07-20 ENCOUNTER — Ambulatory Visit (INDEPENDENT_AMBULATORY_CARE_PROVIDER_SITE_OTHER): Payer: BC Managed Care – PPO | Admitting: Psychiatry

## 2020-07-20 ENCOUNTER — Encounter: Payer: Self-pay | Admitting: Psychiatry

## 2020-07-20 ENCOUNTER — Other Ambulatory Visit: Payer: Self-pay

## 2020-07-20 VITALS — Ht 69.0 in | Wt 180.0 lb

## 2020-07-20 DIAGNOSIS — F422 Mixed obsessional thoughts and acts: Secondary | ICD-10-CM

## 2020-07-20 DIAGNOSIS — F17200 Nicotine dependence, unspecified, uncomplicated: Secondary | ICD-10-CM | POA: Diagnosis not present

## 2020-07-20 DIAGNOSIS — F902 Attention-deficit hyperactivity disorder, combined type: Secondary | ICD-10-CM | POA: Diagnosis not present

## 2020-07-20 DIAGNOSIS — F3342 Major depressive disorder, recurrent, in full remission: Secondary | ICD-10-CM | POA: Diagnosis not present

## 2020-07-20 MED ORDER — FLUVOXAMINE MALEATE 100 MG PO TABS: 100.0000 mg | ORAL_TABLET | Freq: Every day | ORAL | 5 refills | Status: DC

## 2020-07-20 MED ORDER — AMPHETAMINE-DEXTROAMPHETAMINE 20 MG PO TABS: 20.0000 mg | ORAL_TABLET | Freq: Two times a day (BID) | ORAL | 0 refills | Status: DC

## 2020-07-20 NOTE — Progress Notes (Signed)
[facility]  Patient ID: Corey Allison,  MRN: [id number]  PCP: [facility] [place]  Date of Evaluation: 07/20/2020 Time spent:25 minutes from 0905 to 0930  Chief Complaint:  Chief Complaint    ADHD; Anxiety; Depression      HISTORY/CURRENT STATUS: Corey Allison is seen Onsite in office 25 minutes face-to-face individually with consent with epic collateral for psychiatric interview and exam in 72-month evaluation and management of ADHD/OCD, major depression in remission, and tobacco use disorder.  He states he thinks of everything and everybody past and present each day as he describes his diagnoses for treatment as well as his experiences in treatment and relationships in the past.  He notes that it is time to become more serious about renewing relationships as elderly wife of a former business associate so instructed him when she visited their office recently. 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  He now dresses down in the office as the only casual one awaiting the reaction of others. He reviews past providers and medications thinking that his current regimen is most effective for his schedule and appointments, being supportive and containing.  However he is stressed by consideration of change such as my upcoming imminent retirement.  [place] registry documents last dispensing of Adderall 06/24/2020 as the second fill of his 04/22/2020 eScription's.  He has no mania, suicidality, psychosis or delirium.  He ended the potentially evolving relationship with the [employer] professor who considered him in need of testing to determine if he is on the autism spectrum.  In summarial conclusion, [name] is more content, capable, and collaborative over the last 2 years still able to live the lifestyle he chooses while being recognized for his accomplishments in the  lifestyles others require.   Depression Previous medications including prior to treatment here starting 2016 include Celexa, Wellbutrin,Vayarain,Inderal, Ritalin, Concerta, Strattera, Xanax,andAmbien.,The patient presents withdepressionas a recurrentproblemwith lastsevere episode startingmore than 3 yearsago. The onset quality was sudden occurring inopportunely with self defeat intermittently.The symptoms have remittedsince onset.Associated symptoms include decreased focus and  sustained concentration,decreased interest, insomnia,obsessional cognitive inflexibility, compulsive ritualized computer gaming and tobacco, relative suspicion in neurotic projections, andirritable dissatisfaction. Associated symptoms include no fatigue,no helplessnessorhopelessness,nomarginal motivation,nonegativistic relational decompensations,no headaches,no sadness,and no suicidal ideas.The symptoms are aggravated by social issues, work stress and family issues.Past treatments include SSRIs - Selective serotonin reuptake inhibitors, other medications and psychotherapy.Compliance with treatment is variable and good.Past compliance problems include difficulty with treatment plan and medication issues.Previous treatment provided moderaterelief.Risk factors include marital problems, major life event, stress, history of mental illness and family history of mental illness. Past medical history includes anxiety,depression,mental health disorderand obsessive-compulsive disorder. Pertinent negatives include no life-threatening condition,no physical disability,no recent psychiatric admission,no bipolar disorder,no eating disorder,no post-traumatic stress disorder,no schizophrenia,no suicide attemptsand no head trauma.   Individual Medical History/ Review of Systems: Changes? :No   Allergies: Patient has no known allergies.  Current Medications:  Current Outpatient  Medications:  .  albuterol (PROVENTIL HFA;VENTOLIN HFA) 108 (90 Base) MCG/ACT inhaler, Inhale 2 puffs into the lungs every 6 (six) hours as needed., Disp: 1 Inhaler, Rfl: 1 .  amphetamine-dextroamphetamine (ADDERALL) 20 MG tablet, Take 1 tablet (20 mg total) by mouth 2 (two) times daily., Disp: 60 tablet, Rfl: 0 .  [START ON 08/14/2020] amphetamine-dextroamphetamine (ADDERALL) 20 MG tablet, Take 1 tablet (20 mg total) by mouth 2 (two) times daily., Disp:  60 tablet, Rfl: 0 .  [START ON 09/13/2020] amphetamine-dextroamphetamine (ADDERALL) 20 MG tablet, Take 1 tablet (20 mg total) by mouth 2 (two) times daily., Disp: 60 tablet, Rfl: 0 .  [START ON 10/13/2020] amphetamine-dextroamphetamine (ADDERALL) 20 MG tablet, Take 1 tablet (20 mg total) by mouth 2 (two) times daily., Disp: 60 tablet, Rfl: 0 .  azithromycin (ZITHROMAX) 250 MG tablet, Start with 2 tablets today, then 1 daily thereafter., Disp: 6 tablet, Rfl: 0 .  benzonatate (TESSALON) 100 MG capsule, Take 1-2 capsules (100-200 mg total) by mouth 3 (three) times daily as needed., Disp: 60 capsule, Rfl: 0 .  cetirizine (ZYRTEC) 10 MG tablet, Take 1 tablet (10 mg total) by mouth daily., Disp: 30 tablet, Rfl: 11 .  fluvoxaMINE (LUVOX) 100 MG tablet, Take 1 tablet (100 mg total) by mouth at bedtime., Disp: 30 tablet, Rfl: 5 .  HYDROcodone-homatropine (HYCODAN) 5-1.5 MG/5ML syrup, Take 5 mLs by mouth at bedtime as needed., Disp: 100 mL, Rfl: 0 .  Multiple Vitamins-Minerals (MULTIVITAMIN GUMMIES ADULT PO), Take 1 each by mouth daily., Disp: , Rfl:  .  predniSONE (DELTASONE) 10 MG tablet, Take 4 tablets (40 mg total) by mouth daily with breakfast., Disp: 20 tablet, Rfl: 0 .  pseudoephedrine (SUDAFED 12 HOUR) 120 MG 12 hr tablet, Take 1 tablet (120 mg total) by mouth 2 (two) times daily., Disp: 30 tablet, Rfl: 3  Medication Side Effects: none  Family Medical/ Social History: Changes? No  MENTAL HEALTH EXAM:  Height 5\' 9"  (1.753 m), weight 180 lb (81.6  kg).Body mass index is 26.58 kg/m. Muscle strengths and tone 5/5, postural reflexes and gait 0/0, and AIMS = 0.  General Appearance: Casual, Meticulous and Well Groomed  Eye Contact:  Good  Speech:  Clear and Coherent, Normal Rate and Talkative  Volume:  Normal  Mood:  Anxious, Euthymic and Irritable  Affect:  Congruent, Inappropriate, Full Range and Anxious  Thought Process:  Coherent, Goal Directed, Irrelevant, Linear and Descriptions of Associations: Circumstantial  Orientation:  Full (Time, Place, and Person)  Thought Content: Obsessions and Rumination   Suicidal Thoughts:  No  Homicidal Thoughts:  No  Memory:  Immediate;   Good Remote;   Good  Judgement:  Good  Insight:  Fair  Psychomotor Activity:  Normal, Increased and Mannerisms  Concentration:  Concentration: Good and Attention Span: Fair  Recall:  of Knowledge: Good  Language: Good  Assets:  Desire for Improvement Intimacy Leisure Time Resilience Talents/Skills Vocational/Educational  ADL's:  Intact  Cognition: WNL  Prognosis:  Good    DIAGNOSES:    ICD-10-CM   1. Attention deficit hyperactivity disorder (ADHD), combined type, moderate  F90.2 amphetamine-dextroamphetamine (ADDERALL) 20 MG tablet    amphetamine-dextroamphetamine (ADDERALL) 20 MG tablet    amphetamine-dextroamphetamine (ADDERALL) 20 MG tablet  2. Mixed obsessional thoughts and acts  F42.2 fluvoxaMINE (LUVOX) 100 MG tablet  3. Major depression, recurrent, full remission (HCC)  F33.42 fluvoxaMINE (LUVOX) 100 MG tablet  4. Tobacco use disorder, moderate, dependence  F17.200     Receiving Psychotherapy: No    RECOMMENDATIONS: Dynamics of case closure are begun mobilizing with matching therapeutic change and developmental mastery by patient who is now capable of delightful problem-solving with others direct to the point for all without vulnerability himself to blame or psychic pain.  He is therefore prepared to engage in relationship  building again in his personal life similar to his professional life.  He is E scribed Adderall 20 mg IR twice daily  sent as #60 each for November 13, December 13, and January 12 on eScription from last appointment not yet dispensed July 22 expecting fill on October 14 all at [facility] [address] for ADHD.  He is provided eScription for Luvox 100 mg every bedtime sent as #30 with 5 refills to [facility] [address]. for OCD and depression.  Prevention and monitoring safety hygiene are updated to return for follow-up in 10 weeks or sooner if needed, discussing advanced practitioner transition transfer to Corie Chiquito, PMHNP.   Chauncey Mann, MD

## 2020-07-21 ENCOUNTER — Encounter: Payer: Self-pay | Admitting: Psychiatry

## 2020-07-22 ENCOUNTER — Ambulatory Visit: Payer: BC Managed Care – PPO | Admitting: Psychiatry

## 2020-08-14 ENCOUNTER — Other Ambulatory Visit: Payer: Self-pay | Admitting: Psychiatry

## 2020-08-14 DIAGNOSIS — F3342 Major depressive disorder, recurrent, in full remission: Secondary | ICD-10-CM

## 2020-08-14 DIAGNOSIS — F422 Mixed obsessional thoughts and acts: Secondary | ICD-10-CM

## 2020-09-28 ENCOUNTER — Ambulatory Visit: Payer: BC Managed Care – PPO | Admitting: Psychiatry

## 2020-10-20 DIAGNOSIS — F411 Generalized anxiety disorder: Secondary | ICD-10-CM | POA: Diagnosis not present

## 2020-10-20 DIAGNOSIS — R5383 Other fatigue: Secondary | ICD-10-CM | POA: Diagnosis not present

## 2020-10-20 DIAGNOSIS — L989 Disorder of the skin and subcutaneous tissue, unspecified: Secondary | ICD-10-CM | POA: Diagnosis not present

## 2020-10-20 DIAGNOSIS — F39 Unspecified mood [affective] disorder: Secondary | ICD-10-CM | POA: Diagnosis not present

## 2020-11-08 ENCOUNTER — Telehealth: Payer: Self-pay | Admitting: Psychiatry

## 2020-11-08 DIAGNOSIS — F902 Attention-deficit hyperactivity disorder, combined type: Secondary | ICD-10-CM

## 2020-11-08 MED ORDER — AMPHETAMINE-DEXTROAMPHETAMINE 20 MG PO TABS: 20.0000 mg | ORAL_TABLET | Freq: Two times a day (BID) | ORAL | 0 refills | Status: DC

## 2020-11-08 NOTE — Telephone Encounter (Signed)
Scripts sent for today and 3/7.

## 2020-11-08 NOTE — Telephone Encounter (Signed)
Pt use to see dr. Marlyne Beards. He has an appt on 12/30/20 with you. He needs a refill on his adderall 20 mg to be sent to the [facility] on [address]

## 2020-12-14 DIAGNOSIS — L0501 Pilonidal cyst with abscess: Secondary | ICD-10-CM | POA: Diagnosis not present

## 2020-12-30 ENCOUNTER — Ambulatory Visit: Payer: BC Managed Care – PPO | Admitting: Psychiatry

## 2021-01-24 ENCOUNTER — Ambulatory Visit: Payer: Self-pay | Admitting: Surgery

## 2021-01-24 DIAGNOSIS — R2240 Localized swelling, mass and lump, unspecified lower limb: Secondary | ICD-10-CM | POA: Diagnosis not present

## 2021-01-24 DIAGNOSIS — R61 Generalized hyperhidrosis: Secondary | ICD-10-CM | POA: Diagnosis not present

## 2021-01-24 DIAGNOSIS — Z720 Tobacco use: Secondary | ICD-10-CM | POA: Diagnosis not present

## 2021-01-24 DIAGNOSIS — L0889 Other specified local infections of the skin and subcutaneous tissue: Secondary | ICD-10-CM | POA: Diagnosis not present

## 2021-01-24 NOTE — H&P (Signed)
Corey Allison Appointment: 01/24/2021 11:30 AM Location: [facility] Patient #: [id number] DOB: [birth year] Married / Language: [place] / Race: White Male  History of Present Illness Ardeth Sportsman MD; 01/24/2021 4:58 PM) The patient is a 30 year old male who presents with a pilonidal cyst. Note for "Pilonidal cyst": ` ` ` Patient sent for surgical consultation at the request of Hedda Slade  Chief Complaint: Recurrent pilonidal abscess. History of perirectal abscesses. ` ` The patient is a young smoking male who had perirectal abscesses requiring drainage many years ago that eventually healed up. Recalls having a pilonidal abscess in the distant past. More recently he had pain and swelling and required urgent drainage in her office earlier this month. Antibiotics. He felt like. He'll down but he got some intermittent pain and swelling a couple days ago that concerned him. Never got to be draining. Feels a little better now. Patient moves his bowels once or twice a day. Used to smoke a pack a day but is down to about a cigarette or 2 a day now. Not a diabetic. Rather physically active. No sleep apnea. ADHD. No recent steroid taper other he has had it in the past for pulmonary issues. None now.  (Review of systems as stated in this history (HPI) or in the review of systems. Otherwise all other 12 point ROS are negative) ` ` ###########################################`  This patient encounter took 30 minutes today to perform the following: obtain history, perform exam, review outside records, interpret tests & imaging, counsel the patient on their diagnosis; and, document this encounter, including findings & plan in the electronic health record (EHR).   Problem List/Past Medical Ardeth Sportsman, MD; 01/24/2021 5:00 PM) PILONIDAL DISEASE OF NATAL CLEFT (L08.89) PERIANAL ABSCESS (K61.0) PILONIDAL ABSCESS (L05.01) HIP MASS (R22.40) TOBACCO ABUSE  (Z72.0) HYPERHIDROSIS (R61)  Past Surgical History Ardeth Sportsman, MD; 01/24/2021 5:00 PM) Oral Surgery  Diagnostic Studies History Ardeth Sportsman, MD; 01/24/2021 5:00 PM) Colonoscopy never  Allergies Ardeth Sportsman, MD; 01/24/2021 5:00 PM) No Known Drug Allergies [01/27/2015]:  Medication History Ardeth Sportsman, MD; 01/24/2021 5:00 PM) Doxycycline Hyclate (100MG  Tablet, 1 (one) Oral two times daily, Taken starting 01/24/2021) Active. fluvoxaMINE Maleate (100MG  Tablet, Oral) Active. Adderall (5MG  Tablet, Oral) Active.  Social History 01/26/2021, MD; 01/24/2021 5:00 PM) Alcohol use Occasional alcohol use. No caffeine use No drug use Tobacco use Former smoker.  Family History , MD; 01/24/2021 5:00 PM) Heart Disease Father. Heart disease in male family member before age 14 Hypertension Father. Kidney Disease Mother.  Other Problems Ardeth Sportsman, MD; 01/24/2021 5:00 PM) Gastroesophageal Reflux Disease Migraine Headache Sleep Apnea     Physical Exam 51 MD; 01/24/2021 1:56 PM)  General Mental Status-Alert. General Appearance-Not in acute distress, Not Sickly. Orientation-Oriented X3. Hydration-Well hydrated. Voice-Normal.  Integumentary Global Assessment Upon inspection and palpation of skin surfaces of the - Axillae: non-tender, no inflammation or ulceration, no drainage. and Distribution of scalp and body hair is normal. General Characteristics Temperature - normal warmth is noted.  Head and Neck Head-normocephalic, atraumatic with no lesions or palpable masses. Face Global Assessment - atraumatic, no absence of expression. Neck Global Assessment - no abnormal movements, no bruit auscultated on the right, no bruit auscultated on the left, no decreased range of motion, non-tender. Trachea-midline. Thyroid Gland Characteristics - non-tender.  Eye Eyeball - Left-Extraocular movements  intact, No Nystagmus - Left. Eyeball - Right-Extraocular movements intact, No Nystagmus - Right. Cornea -  Left-No Hazy - Left. Cornea - Right-No Hazy - Right. Sclera/Conjunctiva - Left-No scleral icterus, No Discharge - Left. Sclera/Conjunctiva - Right-No scleral icterus, No Discharge - Right. Pupil - Left-Direct reaction to light normal. Pupil - Right-Direct reaction to light normal.  ENMT Ears Pinna - Left - no drainage observed, no generalized tenderness observed. Pinna - Right - no drainage observed, no generalized tenderness observed. Nose and Sinuses External Inspection of the Nose - no destructive lesion observed. Inspection of the nares - Left - quiet respiration. Inspection of the nares - Right - quiet respiration. Mouth and Throat Lips - Upper Lip - no fissures observed, no pallor noted. Lower Lip - no fissures observed, no pallor noted. Nasopharynx - no discharge present. Oral Cavity/Oropharynx - Tongue - no dryness observed. Oral Mucosa - no cyanosis observed. Hypopharynx - no evidence of airway distress observed.  Chest and Lung Exam Inspection Movements - Normal and Symmetrical. Accessory muscles - No use of accessory muscles in breathing. Palpation Palpation of the chest reveals - Non-tender. Auscultation Breath sounds - Normal and Clear.  Cardiovascular Auscultation Rhythm - Regular. Murmurs & Other Heart Sounds - Auscultation of the heart reveals - No Murmurs and No Systolic Clicks.  Abdomen Inspection Inspection of the abdomen reveals - No Visible peristalsis and No Abnormal pulsations. Umbilicus - No Bleeding, No Urine drainage. Palpation/Percussion Palpation and Percussion of the abdomen reveal - Soft, Non Tender, No Rebound tenderness, No Rigidity (guarding) and No Cutaneous hyperesthesia. Note: Abdomen soft. Nontender. Not distended. No umbilical or incisional hernias. No guarding.  Male Genitourinary Sexual Maturity Tanner 5 - Adult hair  pattern and Adult penile size and shape. Note: No inguinal hernias. Normal external genitalia. Epididymi, testes, and spermatic cords normal without any masses. No hidradenitis  Rectal Note: Intergluteal cleft with pits in the upper half. Swelling and scarring especially right upper intergluteal cleft bordering the lumbar region. Prior abscess incision closed down. Old scarring as well consistent with prior incision and drainage Consistent with pilonidal disease. No active drainage or purulence. Mildly sensitive.   Perianal skin clear. Some mild moisture but no pruritus. Some mild scarring anteriorly consistent with prior incision and drainage but no wound or sinus or fistula. No abscess or fissure. No hemorrhoidal disease. I can feel no not recorded or anything concerning for recurrent fistulous or abscess  Peripheral Vascular Upper Extremity Inspection - Left - No Cyanotic nailbeds - Left, Not Ischemic. Inspection - Right - No Cyanotic nailbeds - Right, Not Ischemic.  Neurologic Neurologic evaluation reveals -normal attention span and ability to concentrate, able to name objects and repeat phrases. Appropriate fund of knowledge , normal sensation and normal coordination. Mental Status Affect - not angry, not paranoid. Cranial Nerves-Normal Bilaterally. Gait-Normal.  Neuropsychiatric Mental status exam performed with findings of-able to articulate well with normal speech/language, rate, volume and coherence, thought content normal with ability to perform basic computations and apply abstract reasoning and no evidence of hallucinations, delusions, obsessions or homicidal/suicidal ideation.  Musculoskeletal Global Assessment Spine, Ribs and Pelvis - no instability, subluxation or laxity. Right Upper Extremity - no instability, subluxation or laxity.  Lymphatic Head & Neck  General Head & Neck Lymphatics: Bilateral - Description - No Localized  lymphadenopathy. Axillary  General Axillary Region: Bilateral - Description - No Localized lymphadenopathy. Femoral & Inguinal  Generalized Femoral & Inguinal Lymphatics: Left - Description - No Localized lymphadenopathy. Right - Description - No Localized lymphadenopathy.    Assessment & Plan Ardeth Sportsman(Shaynna Husby C. Lydiann Bonifas MD; 01/24/2021 4:59 PM)  PILONIDAL DISEASE OF NATAL CLEFT (L08.89) Impression: Recurrent abscess with scarring in pits consistent with chronic pilonidal disease.  I think he would benefit from excision with Bascom-type flap closure over drains.  I strongly encouraged him to quit smoking and break the cycle attacks.  In general I think he has some hyperhidrosis wedding in the axillary groin and perirenal regions. Consider more aggressive hygiene to dry the area held in help break the cycle of infections.  Current Plans The anatomy of the intragluteal cleft was discussed. Pathophysiology of pilonidal disease was discussed. The importance of keeping hairs trimmed to avoid recurrence was discussed. Discussion of options such as curretage, excision with closure vs leaving open was discussed. Risks of infection with need for incision and drainage & antibiotics were discussed. I noted a good likelihood this will help address the problem.  At this point, I think the patient would best served with considering surgery to excise the diseased tissue. I will make an attempt to close but it may need to be left open to allow it to heal with secondary intention and wound packing. Possible recurrences need reoperation or different techniques were discussed as well. I noted that recurrence is higher with poor compliance on hair removal hygiene & overall health. The patient's questions were answered. The patient agrees to proceed.  Pt Education - [facility] Pilonidal Surgery HCI - post op instructions: discussed with patient and provided information. Pt Education - [facility] Pilonidal Disease (AT) The anatomy of  the gluteal and sacral region was discussed. Pathophysiology of pilonidal disease due to ingrown hairs was discussed. Importance of good hygiene discussed. Importance of keeping hairs trimmed in the intergluteal crease from anus to top of sacrum/tailbone spine noted. Need for good hygiene stressed. Use of electric razor or nose hair trimmers to keep the hairs trimmed discussed.  Risk of worsening progression needing surgical drainage of abscess or perhaps excision of chronic pilonidal disease with skin flap closure over drains discussed. Possible need for her to leave it open with packing to allow the wound close over several weeks/months discussed. Risks benefits alternatives to surgery discussed as well. Risk of recurrent disease discussed. Patient was expressed understanding. Literature given to patient. We will see if we can control this without an operation first.  Started Doxycycline Hyclate 100 MG Oral Tablet, 1 (one) Tablet two times daily, #14, 01/24/2021, No Refill.  TOBACCO ABUSE (Z72.0) Impression: STOP SMOKING!  We talked to the patient about the dangers of smoking. We stressed that tobacco use dramatically increases the risk of peri-operative complications such as infection, tissue necrosis leaving to problems with incision/wound and organ healing, hernia, chronic pain, heart attack, stroke, DVT, pulmonary embolism, and death. We noted there are programs in our community to help stop smoking. Information was available.  Current Plans Pt Education - [facility] STOP SMOKING!  HYPERHIDROSIS (R61) Impression: Some hyperhidrosis present in the axilla and perirectal region.  Focus on keeping the areas clean and dry. Antiperspirants. No evidence of hidradenitis suppurativa at this time   HIP MASS (R22.40) Impression: After the visit, Ms. Iran Sizer noted that the patient mentioned to her about a subcutaneous mass on his hip. She suspected a lipoma. If he truly has one that is concerning or  larger, I could do removal at the same time. We'll try and regenerative him and see if that is the case.  Ardeth Sportsman, MD, FACS, MASCRS  Esophageal, Gastrointestinal & Colorectal Surgery Robotic and Minimally Invasive Surgery [facility] [address]  [address], [address] [place], [place] [address] [phone] Fax [phone] Main/Paging  CONTACT INFORMATION: Weekday (9AM-5PM) concerns: Call [facility] main office at [phone] Weeknight (5PM-9AM) or Weekend/Holiday concerns: Check [email or web address] for General Surgery [facility] coverage (Please, do not use SecureChat as it is not reliable communication to operating surgeons for immediate patient care)

## 2021-01-29 ENCOUNTER — Emergency Department (HOSPITAL_COMMUNITY)
Admission: EM | Admit: 2021-01-29 | Discharge: 2021-01-30 | Disposition: A | Discharge status: Home / Self Care | Payer: BC Managed Care – PPO | Attending: Emergency Medicine | Admitting: Emergency Medicine

## 2021-01-29 ENCOUNTER — Other Ambulatory Visit: Payer: Self-pay

## 2021-01-29 DIAGNOSIS — F419 Anxiety disorder, unspecified: Secondary | ICD-10-CM | POA: Insufficient documentation

## 2021-01-29 DIAGNOSIS — R0689 Other abnormalities of breathing: Secondary | ICD-10-CM | POA: Diagnosis not present

## 2021-01-29 DIAGNOSIS — F17210 Nicotine dependence, cigarettes, uncomplicated: Secondary | ICD-10-CM | POA: Diagnosis not present

## 2021-01-29 DIAGNOSIS — R064 Hyperventilation: Secondary | ICD-10-CM | POA: Diagnosis not present

## 2021-01-29 DIAGNOSIS — R000 Tachycardia, unspecified: Secondary | ICD-10-CM | POA: Diagnosis not present

## 2021-01-29 DIAGNOSIS — R112 Nausea with vomiting, unspecified: Secondary | ICD-10-CM

## 2021-01-29 DIAGNOSIS — R0902 Hypoxemia: Secondary | ICD-10-CM | POA: Diagnosis not present

## 2021-01-29 LAB — CBC WITH DIFFERENTIAL/PLATELET
Abs Immature Granulocytes: 0.04 10*3/uL (ref 0.00–0.07)
Basophils Absolute: 0 10*3/uL (ref 0.0–0.1)
Basophils Relative: 0 %
Eosinophils Absolute: 0 10*3/uL (ref 0.0–0.5)
Eosinophils Relative: 0 %
HCT: 42.9 % (ref 39.0–52.0)
Hemoglobin: 14.3 g/dL (ref 13.0–17.0)
Immature Granulocytes: 0 %
Lymphocytes Relative: 4 %
Lymphs Abs: 0.4 10*3/uL — ABNORMAL LOW (ref 0.7–4.0)
MCH: 28.7 pg (ref 26.0–34.0)
MCHC: 33.3 g/dL (ref 30.0–36.0)
MCV: 86.1 fL (ref 80.0–100.0)
Monocytes Absolute: 0.6 10*3/uL (ref 0.1–1.0)
Monocytes Relative: 6 %
Neutro Abs: 8.4 10*3/uL — ABNORMAL HIGH (ref 1.7–7.7)
Neutrophils Relative %: 90 %
Platelets: 274 10*3/uL (ref 150–400)
RBC: 4.98 MIL/uL (ref 4.22–5.81)
RDW: 12.3 % (ref 11.5–15.5)
WBC: 9.5 10*3/uL (ref 4.0–10.5)
nRBC: 0 % (ref 0.0–0.2)

## 2021-01-29 MED ORDER — SODIUM CHLORIDE 0.9 % IV BOLUS
1000.0000 mL | Freq: Once | INTRAVENOUS | Status: AC
  Administered 2021-01-29: 1000 mL via INTRAVENOUS

## 2021-01-29 MED ORDER — LORAZEPAM 2 MG/ML IJ SOLN
2.0000 mg | Freq: Once | INTRAMUSCULAR | Status: AC
  Administered 2021-01-29: 2 mg via INTRAVENOUS
  Filled 2021-01-29: qty 1

## 2021-01-29 MED ORDER — ONDANSETRON HCL 4 MG/2ML IJ SOLN
4.0000 mg | Freq: Once | INTRAMUSCULAR | Status: AC
  Administered 2021-01-29: 4 mg via INTRAVENOUS
  Filled 2021-01-29: qty 2

## 2021-01-29 NOTE — ED Provider Notes (Signed)
[facility]-EMERGENCY DEPT Provider Note   CSN: [id number] Arrival date & time: 01/29/21  2130     History Chief Complaint  Patient presents with  . Anxiety    Corey Allison is a 30 y.o. male with a history of ADHD, anxiety, depression, OCD.  Patient presents with a chief complaint of nausea, vomiting, and anxiety.  Nausea and vomiting started late this evening.  Emesis was described as stomach contents.  No bloody emesis or coffee-ground emesis.  Patient endorses generalized abdominal pain.   Endorses 1 alcoholic beverage per week.  Reports he quit smoking in the last few days.  Patient reports previously smoking half pack cigarettes daily.  Patient endorses daily marijuana use.    Patient denies any suicidal ideations, homicidal ideations, auditory hallucinations, visual hallucinations.  Per triage note EMS found patient to be hyperventilating and hypotensive with blood pressure of 80/30.  Patient received 500 cc fluid bolus.  History obtained from patient and patient's mother.  HPI     Past Medical History:  Diagnosis Date  . ADD (attention deficit disorder)   . Anxiety   . Depression   . Headache(784.0)   . OCD (obsessive compulsive disorder)   . Perirectal abscess     Patient Active Problem List   Diagnosis Date Noted  . Tobacco use disorder, moderate, dependence 01/23/2019  . Obsessive compulsive disorder 08/05/2018  . Major depression, recurrent, full remission (HCC) 08/05/2018  . IBS (irritable bowel syndrome) 11/02/2011  . Attention deficit hyperactivity disorder (ADHD), combined type, moderate 11/02/2011  . Fatigue 11/02/2011  . left anterior Perirectal abscess  06/29/2011    Past Surgical History:  Procedure Laterality Date  . WISDOM TOOTH EXTRACTION         Family History  Problem Relation Age of Onset  . Heart disease Father   . Celiac disease Father   . Kidney disease Maternal Grandfather   . Colon cancer Neg Hx      Social History   Tobacco Use  . Smoking status: Current Every Day Smoker    Packs/day: 0.25    Types: Cigarettes    Last attempt to quit: 06/23/2011    Years since quitting: 9.6  . Smokeless tobacco: Never Used  . Tobacco comment: tobacco handout given 11/02/2011  Vaping Use  . Vaping Use: Former  Substance Use Topics  . Alcohol use: Yes    Alcohol/week: 5.0 standard drinks    Types: 5 Standard drinks or equivalent per week  . Drug use: Yes    Types: Marijuana    Home Medications Prior to Admission medications   Medication Sig Start Date End Date Taking? Authorizing Provider  albuterol (PROVENTIL HFA;VENTOLIN HFA) 108 (90 Base) MCG/ACT inhaler Inhale 2 puffs into the lungs every 6 (six) hours as needed. 03/14/18   Wallis Bamberg, PA-C  amphetamine-dextroamphetamine (ADDERALL) 20 MG tablet Take 1 tablet (20 mg total) by mouth 2 (two) times daily. 09/13/20 10/13/20  Chauncey Mann, MD  amphetamine-dextroamphetamine (ADDERALL) 20 MG tablet Take 1 tablet (20 mg total) by mouth 2 (two) times daily. 10/13/20 11/12/20  Chauncey Mann, MD  amphetamine-dextroamphetamine (ADDERALL) 20 MG tablet Take 1 tablet (20 mg total) by mouth 2 (two) times daily. 12/06/20 01/05/21  Corie Chiquito, PMHNP  amphetamine-dextroamphetamine (ADDERALL) 20 MG tablet Take 1 tablet (20 mg total) by mouth 2 (two) times daily. 11/08/20 12/08/20  Corie Chiquito, PMHNP  azithromycin (ZITHROMAX) 250 MG tablet Start with 2 tablets today, then 1 daily thereafter. 03/14/18  Wallis Bamberg, PA-C  benzonatate (TESSALON) 100 MG capsule Take 1-2 capsules (100-200 mg total) by mouth 3 (three) times daily as needed. 03/14/18   Wallis Bamberg, PA-C  cetirizine (ZYRTEC) 10 MG tablet Take 1 tablet (10 mg total) by mouth daily. 03/14/18   Wallis Bamberg, PA-C  fluvoxaMINE (LUVOX) 100 MG tablet Take 1 tablet (100 mg total) by mouth at bedtime. 07/20/20   Chauncey Mann, MD  HYDROcodone-homatropine [facility]) 5-1.5 MG/5ML syrup Take 5 mLs by mouth  at bedtime as needed. 03/14/18   Wallis Bamberg, PA-C  Multiple Vitamins-Minerals (MULTIVITAMIN GUMMIES ADULT PO) Take 1 each by mouth daily.    [provider]  predniSONE (DELTASONE) 10 MG tablet Take 4 tablets (40 mg total) by mouth daily with breakfast. 03/14/18   Wallis Bamberg, PA-C  pseudoephedrine (SUDAFED 12 HOUR) 120 MG 12 hr tablet Take 1 tablet (120 mg total) by mouth 2 (two) times daily. 03/14/18   Wallis Bamberg, PA-C    Allergies    Patient has no known allergies.  Review of Systems   Review of Systems  Constitutional: Negative for chills and fever.  Eyes: Negative for visual disturbance.  Respiratory: Positive for chest tightness and shortness of breath.   Cardiovascular: Negative for chest pain.  Gastrointestinal: Positive for abdominal pain, nausea and vomiting. Negative for abdominal distention, anal bleeding, blood in stool, constipation, diarrhea and rectal pain.  Genitourinary: Negative for difficulty urinating, dysuria, flank pain, frequency, hematuria, penile discharge, penile pain, penile swelling, scrotal swelling and testicular pain.  Musculoskeletal: Negative for back pain and neck pain.  Skin: Negative for color change and rash.  Neurological: Positive for numbness (bilateral hands and face).  Psychiatric/Behavioral: Negative for confusion. The patient is nervous/anxious.     Physical Exam Updated Vital Signs BP 130/77   Pulse 100   Temp 98.4 F (36.9 C) (Oral)   Resp (!) 22   Wt 81.6 kg   SpO2 96%   BMI 26.58 kg/m   Physical Exam Vitals and nursing note reviewed.  Constitutional:      General: He is not in acute distress.    Appearance: He is not ill-appearing, toxic-appearing or diaphoretic.  HENT:     Head: Normocephalic.  Eyes:     General: No scleral icterus.       Right eye: No discharge.        Left eye: No discharge.     Extraocular Movements: Extraocular movements intact.     Pupils: Pupils are equal, round, and reactive to light.   Cardiovascular:     Rate and Rhythm: Normal rate.  Pulmonary:     Effort: Pulmonary effort is normal. Tachypnea present. No respiratory distress.     Breath sounds: Normal breath sounds.  Abdominal:     General: Abdomen is flat. Bowel sounds are normal. There is no distension.     Palpations: Abdomen is soft. There is no mass or pulsatile mass.     Tenderness: There is no abdominal tenderness. There is no guarding or rebound.     Hernia: There is no hernia in the umbilical area or ventral area.  Musculoskeletal:     Cervical back: Normal range of motion and neck supple.     Right lower leg: No swelling or tenderness. No edema.     Left lower leg: No swelling or tenderness. No edema.  Skin:    General: Skin is warm and dry.     Coloration: Skin is not jaundiced or pale.  Neurological:  General: No focal deficit present.     Mental Status: He is alert.     GCS: GCS eye subscore is 4. GCS verbal subscore is 5. GCS motor subscore is 6.     Cranial Nerves: No facial asymmetry.     Comments: Patient able to move bilateral upper and lower extremities equally without difficulty.  Psychiatric:        Behavior: Behavior is cooperative.     ED Results / Procedures / Treatments   Labs (all labs ordered are listed, but only abnormal results are displayed) Labs Reviewed  CBC WITH DIFFERENTIAL/PLATELET - Abnormal; Notable for the following components:      Result Value   Neutro Abs 8.4 (*)    Lymphs Abs 0.4 (*)    All other components within normal limits  COMPREHENSIVE METABOLIC PANEL  LIPASE, BLOOD  URINALYSIS, ROUTINE W REFLEX MICROSCOPIC  RAPID URINE DRUG SCREEN, HOSP PERFORMED    EKG None  Radiology No results found.  Procedures Procedures   Medications Ordered in ED Medications  ondansetron (ZOFRAN) injection 4 mg (has no administration in time range)  LORazepam (ATIVAN) injection 2 mg (2 mg Intravenous Given 01/29/21 2232)  sodium chloride 0.9 % bolus 1,000 mL  (1,000 mLs Intravenous New Bag/Given 01/29/21 2313)    ED Course  I have reviewed the triage vital signs and the nursing notes.  Pertinent labs & imaging results that were available during my care of the patient were reviewed by me and considered in my medical decision making (see chart for details).  Clinical Course as of 01/30/21 0102  Sun Jan 30, 2021  0101 MCHC: 33.3 [PB]    Clinical Course User Index [PB] Berneice Heinrich   MDM Rules/Calculators/A&P                            Patient presents with chief complaint of nausea, vomiting, anxiety.  His nausea and vomiting started this evening causing him to become anxious.  He started hyperventilating felt like he could not breathe developed chest tightness and numbness to his hands and face.  EMS was called.  Per triage note patient was found hyperventilating with blood pressure of 80/30.  Patient was given 500 mL fluid bolus.  On arrival to emergency department blood pressure was 95/75.  2220 entered patient's room to find him hyperventilating stating that he could not breath.  Patient noted to have cramping to bilateral hands.  Patient endorsed chest tightness and numbness to face and hands.  Vital signs were hemodynamically stable.  Oxygen saturation 99% on room air.  Pupils PERRL, EOM intact, moves all extremities equally without difficulty.  Patient was coached on breathing and placed on 2 LPM of O2 via nasal cannula.  2 mg Ativan ordered.  Patient appears more relaxed after receiving Ativan.    Suspect that patient's hypotension with EMS was due to vasovagal response.  Patient's pressures have been hemodynamically stable while in the emergency department.  Will order CMP, CBC, lipase, urinalysis, evaluate for patient's nausea and vomiting.  Will order UDS.  Patient given 1 L fluid bolus.  On reassessment patient's chest tightness, shortness of breath, and numbness have resolved.  Patient still endorses nausea.  Will order  Zofran medication.  If all lab work is unremarkable and patient remains hemodynamically stable plan to discharge home with follow-up with his psychiatrist.  Patient care transferred to PA Sanders at the end of my shift. Patient  presentation, ED course, and plan of care discussed with review of all pertinent labs and imaging. Please see his/her note for further details regarding further ED course and disposition.  Final Clinical Impression(s) / ED Diagnoses Final diagnoses:  None    Rx / DC Orders ED Discharge Orders    None       Berneice HeinrichBadalamente, Shaqueena Mauceri R, PA-C 01/29/21 2338    Melene PlanFloyd, Dan, DO 01/30/21 1501

## 2021-01-29 NOTE — ED Triage Notes (Signed)
Pt came in with c/o anxiety, N/V. Pt has been out of his adderall for 5 days. He has also quit smoking in the last few days. EMS states pt was hyperventilating and he also had hypotension of 80/30 on arrival. Pt was having dinner with his family. Pt received 500cc bolus of NS. Current pressure 95/75

## 2021-01-29 NOTE — ED Notes (Signed)
Blood work sent to lab.

## 2021-01-29 NOTE — ED Notes (Signed)
Mother at bedside.

## 2021-01-29 NOTE — ED Notes (Signed)
Pt also c/o pain in his stomach and chest

## 2021-01-30 LAB — COMPREHENSIVE METABOLIC PANEL
ALT: 42 U/L (ref 0–44)
AST: 33 U/L (ref 15–41)
Albumin: 4.2 g/dL (ref 3.5–5.0)
Alkaline Phosphatase: 60 U/L (ref 38–126)
Anion gap: 12 (ref 5–15)
BUN: 19 mg/dL (ref 6–20)
CO2: 18 mmol/L — ABNORMAL LOW (ref 22–32)
Calcium: 9.1 mg/dL (ref 8.9–10.3)
Chloride: 111 mmol/L (ref 98–111)
Creatinine, Ser: 0.7 mg/dL (ref 0.61–1.24)
GFR, Estimated: >60 mL/min (ref >60)
Glucose, Bld: 112 mg/dL — ABNORMAL HIGH (ref 70–99)
Potassium: 3.7 mmol/L (ref 3.5–5.1)
Sodium: 141 mmol/L (ref 135–145)
Total Bilirubin: 1.1 mg/dL (ref 0.3–1.2)
Total Protein: 6.8 g/dL (ref 6.5–8.1)

## 2021-01-30 LAB — LIPASE, BLOOD: Lipase: 23 U/L (ref 11–51)

## 2021-01-30 MED ORDER — ONDANSETRON 4 MG PO TBDP: 4.0000 mg | ORAL_TABLET | Freq: Three times a day (TID) | ORAL | 0 refills | Status: AC | PRN

## 2021-01-30 NOTE — ED Provider Notes (Signed)
  Assumed care from [facility].  See prior notes for full H&P.  Briefly, 30 y.o. M here for vomiting.  Reportedly alarmed by amount of emesis and had panic attack.  Brief hypotension, quick rebound with IVF by EMS.  Question vasovagal.  Given IVF, zofran, ativan here with improvement.  Plan:  Labs pending.  Likely d/c if tolerating PO.  Results for orders placed or performed during the hospital encounter of 01/29/21  CBC with Differential  Result Value Ref Range   WBC 9.5 4.0 - 10.5 K/uL   RBC 4.98 4.22 - 5.81 MIL/uL   Hemoglobin 14.3 13.0 - 17.0 g/dL   HCT 01.6 01.0 - 93.2 %   MCV 86.1 80.0 - 100.0 fL   MCH 28.7 26.0 - 34.0 pg   MCHC 33.3 30.0 - 36.0 g/dL   RDW 35.5 73.2 - 20.2 %   Platelets 274 150 - 400 K/uL   nRBC 0.0 0.0 - 0.2 %   Neutrophils Relative % 90 %   Neutro Abs 8.4 (H) 1.7 - 7.7 K/uL   Lymphocytes Relative 4 %   Lymphs Abs 0.4 (L) 0.7 - 4.0 K/uL   Monocytes Relative 6 %   Monocytes Absolute 0.6 0.1 - 1.0 K/uL   Eosinophils Relative 0 %   Eosinophils Absolute 0.0 0.0 - 0.5 K/uL   Basophils Relative 0 %   Basophils Absolute 0.0 0.0 - 0.1 K/uL   Immature Granulocytes 0 %   Abs Immature Granulocytes 0.04 0.00 - 0.07 K/uL  Comprehensive metabolic panel  Result Value Ref Range   Sodium 141 135 - 145 mmol/L   Potassium 3.7 3.5 - 5.1 mmol/L   Chloride 111 98 - 111 mmol/L   CO2 18 (L) 22 - 32 mmol/L   Glucose, Bld 112 (H) 70 - 99 mg/dL   BUN 19 6 - 20 mg/dL   Creatinine, Ser 5.42 0.61 - 1.24 mg/dL   Calcium 9.1 8.9 - 70.6 mg/dL   Total Protein 6.8 6.5 - 8.1 g/dL   Albumin 4.2 3.5 - 5.0 g/dL   AST 33 15 - 41 U/L   ALT 42 0 - 44 U/L   Alkaline Phosphatase 60 38 - 126 U/L   Total Bilirubin 1.1 0.3 - 1.2 mg/dL   GFR, Estimated >23 >76 mL/min   Anion gap 12 5 - 15  Lipase, blood  Result Value Ref Range   Lipase 23 11 - 51 U/L   No results found.   1:03 AM Labs reassuring.  patient states he is feeling much better.  No further emesis here and is currently  eating ice chips.  VS have remained stable.  Feel he is appropriate for discharge home.  Continued symptomatic care at home, push PO fluids.  Close PCP follow-up.  Return here for new concerns.   Garlon Hatchet, PA-C 01/30/21 0106    Nira Conn, MD 01/30/21 [phone]

## 2021-01-30 NOTE — Discharge Instructions (Signed)
Labs today were reassuring. Can continue using zofran as needed for nausea/vomiting. Try and stay hydrated. Follow-up with your primary care doctor. Return here for new concerns.

## 2021-01-31 ENCOUNTER — Encounter: Payer: Self-pay | Admitting: Behavioral Health

## 2021-01-31 ENCOUNTER — Other Ambulatory Visit: Payer: Self-pay

## 2021-01-31 ENCOUNTER — Ambulatory Visit (INDEPENDENT_AMBULATORY_CARE_PROVIDER_SITE_OTHER): Payer: BC Managed Care – PPO | Admitting: Behavioral Health

## 2021-01-31 VITALS — BP 113/75 | HR 92 | Ht 69.0 in | Wt 184.0 lb

## 2021-01-31 DIAGNOSIS — F422 Mixed obsessional thoughts and acts: Secondary | ICD-10-CM

## 2021-01-31 DIAGNOSIS — F900 Attention-deficit hyperactivity disorder, predominantly inattentive type: Secondary | ICD-10-CM

## 2021-01-31 MED ORDER — AMPHETAMINE-DEXTROAMPHETAMINE 15 MG PO TABS: 15.0000 mg | ORAL_TABLET | Freq: Two times a day (BID) | ORAL | 0 refills | Status: DC

## 2021-01-31 NOTE — Progress Notes (Signed)
Corey Allison [id number] 1991/10/02 30 y.o.  Subjective:   Patient ID:  Corey Allison is a 30 y.o. (DOB 10-Mar-1991) male.  Chief Complaint:  Chief Complaint  Patient presents with  . ADHD  . Follow-up    HPI Corey Allison presents to the office today for follow-up and medication management. Says he is here today as previous patient of Dr. Marlyne Beards. Said he called last week but could not get appointment to get his Adderall refilled. Pt reports taking the medication for over 10 years. York Spaniel he was having withdrawal symptoms and ended up going to the ER over the weekend. He said that he had IV administered but not sure how they treated his symptoms or with what medications. He said that he still feels like he is experiencing some withdrawals and describes tightness across his abdomen, increased anxiety, and fogginess. Says he just feels sick. Pt says his anxiety level today is 6 and depression 3 but says his mood is skewed due to being off meds. He reports that he sleeping well with 7-8 hours per night. He says that he is not currently in his busy season for work and would like to try reducing amphetamine dosage down to 15 mg twice a day instead of the 20 mg.    No prior psychiatric med failures noted this visit  Review of Systems:  Review of Systems  Musculoskeletal: Negative for gait problem.  Neurological: Negative for tremors.    Medications:   Current Outpatient Medications  Medication Sig Dispense Refill  . albuterol (PROVENTIL HFA;VENTOLIN HFA) 108 (90 Base) MCG/ACT inhaler Inhale 2 puffs into the lungs every 6 (six) hours as needed. 1 Inhaler 1  . amphetamine-dextroamphetamine (ADDERALL) 15 MG tablet Take 1 tablet by mouth 2 (two) times daily. 60 tablet 0  . Multiple Vitamins-Minerals (MULTIVITAMIN GUMMIES ADULT PO) Take 1 each by mouth daily.    . ondansetron (ZOFRAN ODT) 4 MG disintegrating tablet Take 1 tablet (4 mg total) by mouth every 8 (eight) hours as needed for  nausea. 10 tablet 0  . azithromycin (ZITHROMAX) 250 MG tablet Start with 2 tablets today, then 1 daily thereafter. (Patient not taking: Reported on 01/31/2021) 6 tablet 0  . benzonatate (TESSALON) 100 MG capsule Take 1-2 capsules (100-200 mg total) by mouth 3 (three) times daily as needed. (Patient not taking: Reported on 01/31/2021) 60 capsule 0  . cetirizine (ZYRTEC) 10 MG tablet Take 1 tablet (10 mg total) by mouth daily. (Patient not taking: Reported on 01/31/2021) 30 tablet 11  . fluvoxaMINE (LUVOX) 100 MG tablet Take 1 tablet (100 mg total) by mouth at bedtime. 30 tablet 5  . HYDROcodone-homatropine (HYCODAN) 5-1.5 MG/5ML syrup Take 5 mLs by mouth at bedtime as needed. (Patient not taking: Reported on 01/31/2021) 100 mL 0  . predniSONE (DELTASONE) 10 MG tablet Take 4 tablets (40 mg total) by mouth daily with breakfast. (Patient not taking: Reported on 01/31/2021) 20 tablet 0  . pseudoephedrine (SUDAFED 12 HOUR) 120 MG 12 hr tablet Take 1 tablet (120 mg total) by mouth 2 (two) times daily. (Patient not taking: Reported on 01/31/2021) 30 tablet 3   No current facility-administered medications for this visit.    Medication Side Effects:none  Allergies: No Known Allergies  Past Medical History:  Diagnosis Date  . ADD (attention deficit disorder)   . Anxiety   . Depression   . Headache(784.0)   . OCD (obsessive compulsive disorder)   . Perirectal abscess      Past Medical History,  Surgical history, Social history, and Family history were reviewed and updated as appropriate.   Please see review of systems for further details on the patient's review from today.   Objective:   Physical Exam:  BP 113/75   Pulse 92   Ht 5\' 9"  (1.753 m)   Wt 184 lb (83.5 kg)   BMI 27.17 kg/m   Physical Exam Constitutional:      General: He is not in acute distress.    Appearance: Normal appearance.  Neurological:     Mental Status: He is alert and oriented to person, place, and time.     Gait: Gait  normal.  Psychiatric:        Attention and Perception: Attention and perception normal. He does not perceive auditory or visual hallucinations.        Mood and Affect: Mood and affect normal. Mood is not anxious or depressed. Affect is not labile.        Speech: Speech normal.        Behavior: Behavior normal. Behavior is cooperative.        Thought Content: Thought content normal.        Cognition and Memory: Cognition and memory normal.        Judgment: Judgment normal.     Lab Review:     Component Value Date/Time   NA 141 01/29/2021 2302   K 3.7 01/29/2021 2302   CL 111 01/29/2021 2302   CO2 18 (L) 01/29/2021 2302   GLUCOSE 112 (H) 01/29/2021 2302   BUN 19 01/29/2021 2302   CREATININE 0.70 01/29/2021 2302   CALCIUM 9.1 01/29/2021 2302   PROT 6.8 01/29/2021 2302   ALBUMIN 4.2 01/29/2021 2302   AST 33 01/29/2021 2302   ALT 42 01/29/2021 2302   ALKPHOS 60 01/29/2021 2302   BILITOT 1.1 01/29/2021 2302   GFRNONAA >60 01/29/2021 2302   GFRAA >60 03/03/2018 2105       Component Value Date/Time   WBC 9.5 01/29/2021 2302   RBC 4.98 01/29/2021 2302   HGB 14.3 01/29/2021 2302   HCT 42.9 01/29/2021 2302   PLT 274 01/29/2021 2302   MCV 86.1 01/29/2021 2302   MCH 28.7 01/29/2021 2302   MCHC 33.3 01/29/2021 2302   RDW 12.3 01/29/2021 2302   LYMPHSABS 0.4 (L) 01/29/2021 2302   MONOABS 0.6 01/29/2021 2302   EOSABS 0.0 01/29/2021 2302   BASOSABS 0.0 01/29/2021 2302    No results found for: POCLITH, LITHIUM   No results found for: PHENYTOIN, PHENOBARB, VALPROATE, CBMZ   .res Assessment: Plan:    Brent was seen today for adhd and follow-up.  Diagnoses and all orders for this visit:  Attention deficit hyperactivity disorder (ADHD), predominantly inattentive type -     amphetamine-dextroamphetamine (ADDERALL) 15 MG tablet; Take 1 tablet by mouth 2 (two) times daily.  Mixed obsessional thoughts and acts   Plan: Patient will decrease Adderall 20 mg tablet to 15 mg  twice daily Will report any worsening symptoms or side effects promptly Patient will follow up in 4 weeks to reassess Discussed potential benefits, risks, and side effects of stimulants with patient to include increased heart rate, palpitations, insomnia, increased anxiety, increased irritability, or decreased appetite.  Instructed patient to contact office if experiencing any significant tolerability issues. Greater than 50% of face to face time with patient was spent on counseling and coordination of care. We discussed smoking cessation. Reviewed importance of not waiting to late before seeking refills on weekend or  holidays. Recommended psychotherapy.     Please see After Visit Summary for patient specific instructions.  Future Appointments  Date Time Provider Department Center  03/01/2021  8:00 AM Avelina Laine A, NP [facility] None    No orders of the defined types were placed in this encounter.   -------------------------------

## 2021-02-03 DIAGNOSIS — H5213 Myopia, bilateral: Secondary | ICD-10-CM | POA: Diagnosis not present

## 2021-02-03 DIAGNOSIS — H52223 Regular astigmatism, bilateral: Secondary | ICD-10-CM | POA: Diagnosis not present

## 2021-02-05 ENCOUNTER — Other Ambulatory Visit: Payer: Self-pay | Admitting: Psychiatry

## 2021-02-05 DIAGNOSIS — F422 Mixed obsessional thoughts and acts: Secondary | ICD-10-CM

## 2021-02-05 DIAGNOSIS — F3342 Major depressive disorder, recurrent, in full remission: Secondary | ICD-10-CM

## 2021-03-01 ENCOUNTER — Ambulatory Visit: Payer: BC Managed Care – PPO | Admitting: Behavioral Health

## 2021-03-10 ENCOUNTER — Other Ambulatory Visit: Payer: Self-pay

## 2021-03-10 ENCOUNTER — Ambulatory Visit: Payer: BC Managed Care – PPO | Admitting: Behavioral Health

## 2021-03-10 ENCOUNTER — Encounter: Payer: Self-pay | Admitting: Behavioral Health

## 2021-03-10 DIAGNOSIS — F422 Mixed obsessional thoughts and acts: Secondary | ICD-10-CM | POA: Diagnosis not present

## 2021-03-10 DIAGNOSIS — F3342 Major depressive disorder, recurrent, in full remission: Secondary | ICD-10-CM

## 2021-03-10 DIAGNOSIS — F900 Attention-deficit hyperactivity disorder, predominantly inattentive type: Secondary | ICD-10-CM

## 2021-03-10 MED ORDER — AMPHETAMINE-DEXTROAMPHETAMINE 15 MG PO TABS: 15.0000 mg | ORAL_TABLET | Freq: Two times a day (BID) | ORAL | 0 refills | Status: DC

## 2021-03-10 MED ORDER — AMPHETAMINE-DEXTROAMPHETAMINE 20 MG PO TABS: 20.0000 mg | ORAL_TABLET | Freq: Two times a day (BID) | ORAL | 0 refills | Status: DC

## 2021-03-10 MED ORDER — FLUVOXAMINE MALEATE 100 MG PO TABS: ORAL_TABLET | ORAL | 5 refills | Status: DC

## 2021-03-10 NOTE — Progress Notes (Signed)
[facility]  Patient ID: Corey Allison,  MRN: [id number]  PCP: [facility] [place]  Date of Evaluation: 03/10/2021 Time spent:20 minutes  Chief Complaint:  Chief Complaint   Medication Problem; Medication Refill; Follow-up; Anxiety; Depression; ADHD     HISTORY/CURRENT STATUS: HPI 30 year old male presents to this office for follow up and medication management. He reports that when he chose to reduce his Adderall from 20 mg twice daily to 15 mg twice daily, "it may have been a mistake". He says that he is still doing well overall but notices some decline on attention and focus at work, especially projects. He says he would like to increase the dose back up to 20 mg. He reports anxiety today at 2 and depression 0. He is sleeping 7-8 hours per night. No mania, no psychosis. No SI/HI  No prior psychiatric med failures    Individual Medical History/ Review of Systems: Changes? :No   Allergies: Patient has no known allergies.  Current Medications:  Current Outpatient Medications:    albuterol (PROVENTIL HFA;VENTOLIN HFA) 108 (90 Base) MCG/ACT inhaler, Inhale 2 puffs into the lungs every 6 (six) hours as needed., Disp: 1 Inhaler, Rfl: 1   amphetamine-dextroamphetamine (ADDERALL) 20 MG tablet, Take 1 tablet (20 mg total) by mouth 2 (two) times daily., Disp: 60 tablet, Rfl: 0   azithromycin (ZITHROMAX) 250 MG tablet, Start with 2 tablets today, then 1 daily thereafter. (Patient not taking: No sig reported), Disp: 6 tablet, Rfl: 0   benzonatate (TESSALON) 100 MG capsule, Take 1-2 capsules (100-200 mg total) by mouth 3 (three) times daily as needed. (Patient not taking: No sig reported), Disp: 60 capsule, Rfl: 0   cetirizine (ZYRTEC) 10 MG tablet, Take 1 tablet (10 mg total) by mouth daily. (Patient not taking: No sig reported), Disp: 30 tablet, Rfl: 11   fluvoxaMINE (LUVOX) 100 MG tablet, TAKE 1 TABLET(100 MG) BY MOUTH AT BEDTIME, Disp: 30 tablet, Rfl: 5    HYDROcodone-homatropine (HYCODAN) 5-1.5 MG/5ML syrup, Take 5 mLs by mouth at bedtime as needed. (Patient not taking: No sig reported), Disp: 100 mL, Rfl: 0   Multiple Vitamins-Minerals (MULTIVITAMIN GUMMIES ADULT PO), Take 1 each by mouth daily. (Patient not taking: Reported on 03/10/2021), Disp: , Rfl:    ondansetron (ZOFRAN ODT) 4 MG disintegrating tablet, Take 1 tablet (4 mg total) by mouth every 8 (eight) hours as needed for nausea. (Patient not taking: Reported on 03/10/2021), Disp: 10 tablet, Rfl: 0   predniSONE (DELTASONE) 10 MG tablet, Take 4 tablets (40 mg total) by mouth daily with breakfast. (Patient not taking: No sig reported), Disp: 20 tablet, Rfl: 0   pseudoephedrine (SUDAFED 12 HOUR) 120 MG 12 hr tablet, Take 1 tablet (120 mg total) by mouth 2 (two) times daily. (Patient not taking: No sig reported), Disp: 30 tablet, Rfl: 3 Medication Side Effects: none  Family Medical/ Social History: Changes? No  MENTAL HEALTH EXAM:  There were no vitals taken for this visit.There is no height or weight on file to calculate BMI.    Eye Contact:  Fair  Speech:  Clear and Coherent  Volume:  Normal  Mood:  NA  Affect:  Appropriate  Thought Process:  Coherent  Orientation:  Full (Time, Place, and Person)  Thought Content: Logical   Suicidal Thoughts:  No  Homicidal Thoughts:  No  Memory:  WNL  Judgement:  Fair  Insight:  Fair  Psychomotor Activity:  Normal  Concentration:  Concentration: Fair  Recall:  Good  Fund of Knowledge: Good  Language: Good  Assets:  Desire for Improvement  ADL's:  Intact  Cognition: WNL  Prognosis:  Good    DIAGNOSES:    ICD-10-CM   1. Attention deficit hyperactivity disorder (ADHD), predominantly inattentive type  F90.0 amphetamine-dextroamphetamine (ADDERALL) 20 MG tablet    DISCONTINUED: amphetamine-dextroamphetamine (ADDERALL) 15 MG tablet    2. Mixed obsessional thoughts and acts  F42.2 fluvoxaMINE (LUVOX) 100 MG tablet    3. Major depression,  recurrent, full remission (HCC)  F33.42 fluvoxaMINE (LUVOX) 100 MG tablet      Receiving Psychotherapy: No    RECOMMENDATIONS:   Patient will increase back up to Adderall 20 mg tablet twice daily Will report any worsening symptoms or side effects promptly Patient will follow up in 3 months to reassess Discussed potential benefits, risks, and side effects of stimulants with patient to include increased heart rate, palpitations, insomnia, increased anxiety, increased irritability, or decreased appetite.  Instructed patient to contact office if experiencing any significant tolerability issues. Greater than 50% of 20 min. face to face time with patient was spent on counseling and coordination of care. We discussed smoking cessation. Reviewed importance of not waiting to late before seeking refills on weekend or holidays. Recommended psychotherapy.   Joan Flores, NP

## 2021-05-20 ENCOUNTER — Other Ambulatory Visit: Payer: Self-pay | Admitting: Behavioral Health

## 2021-05-20 ENCOUNTER — Telehealth: Payer: Self-pay | Admitting: Behavioral Health

## 2021-05-20 DIAGNOSIS — F900 Attention-deficit hyperactivity disorder, predominantly inattentive type: Secondary | ICD-10-CM

## 2021-05-20 MED ORDER — AMPHETAMINE-DEXTROAMPHETAMINE 20 MG PO TABS: 20.0000 mg | ORAL_TABLET | Freq: Every day | ORAL | 0 refills | Status: DC

## 2021-05-20 MED ORDER — AMPHETAMINE-DEXTROAMPHETAMINE 20 MG PO TABS: 20.0000 mg | ORAL_TABLET | Freq: Two times a day (BID) | ORAL | 0 refills | Status: DC

## 2021-05-20 NOTE — Progress Notes (Signed)
Patient called in and [facility] did not have any Adderall available at this time. [facility] [address]. One of his 3 scripts was sent to that location. The post dated scripts will be kept at [facility].

## 2021-05-20 NOTE — Telephone Encounter (Signed)
If he is ok with it we can [facility] to [facility] [address]. There is shortage of Adderall in the area and they have it.

## 2021-05-20 NOTE — Progress Notes (Signed)
Escribed 3 refills of Adderall 20 mg twice daily to pharmacy.

## 2021-05-20 NOTE — Telephone Encounter (Signed)
Addendum to previous message today. Appt is 06/02/21. All [facility] in the area are out of stock of Adderall 20 mg. Jeri is requesting a signed script to take to the pharmacies and if they have it he can hand them the script. Her phone number is [phone]. He will pick the RX up.

## 2021-05-20 NOTE — Telephone Encounter (Signed)
Refill sent in "immediately".

## 2021-05-20 NOTE — Telephone Encounter (Signed)
He said yes that's fine

## 2021-05-20 NOTE — Telephone Encounter (Signed)
Pt called and  needs a refill on his adderall  [facility] [address]. He wants 3 month  sent in Immediately

## 2021-05-20 NOTE — Telephone Encounter (Signed)
Please review

## 2021-06-02 ENCOUNTER — Other Ambulatory Visit: Payer: Self-pay

## 2021-06-02 ENCOUNTER — Encounter: Payer: Self-pay | Admitting: Behavioral Health

## 2021-06-02 ENCOUNTER — Ambulatory Visit: Payer: BC Managed Care – PPO | Admitting: Behavioral Health

## 2021-06-02 DIAGNOSIS — F17200 Nicotine dependence, unspecified, uncomplicated: Secondary | ICD-10-CM | POA: Diagnosis not present

## 2021-06-02 DIAGNOSIS — F422 Mixed obsessional thoughts and acts: Secondary | ICD-10-CM

## 2021-06-02 DIAGNOSIS — F902 Attention-deficit hyperactivity disorder, combined type: Secondary | ICD-10-CM

## 2021-06-02 NOTE — Progress Notes (Signed)
[facility]  Patient ID: Corey Allison,  MRN: [id number]  PCP: [facility] [place]  Date of Evaluation: 06/02/2021 Time spent:30 minutes  Chief Complaint:  Chief Complaint   ADHD; Medication Problem; Follow-up     HISTORY/CURRENT STATUS: HPI 30 year old male presents to this office for follow up and medication management. He is current doing well and says he is very productive at work. He has been upset about not being able to get Adderall made by [employer] at [facility] due to shortage. He says there is nothing to do at this time but tough it out for 18 days and hopefully they will get more in.  He does not want to make changes on dosage at this time.  He reports anxiety today at 2 and depression 0. He is sleeping 7-8 hours per night. No mania, no psychosis. No SI/HI   Individual Medical History/ Review of Systems: Changes? :No   Allergies: Patient has no known allergies.  Current Medications:  Current Outpatient Medications:    albuterol (PROVENTIL HFA;VENTOLIN HFA) 108 (90 Base) MCG/ACT inhaler, Inhale 2 puffs into the lungs every 6 (six) hours as needed., Disp: 1 Inhaler, Rfl: 1   [START ON 06/20/2021] amphetamine-dextroamphetamine (ADDERALL) 20 MG tablet, Take 1 tablet (20 mg total) by mouth daily., Disp: 60 tablet, Rfl: 0   [START ON 07/20/2021] amphetamine-dextroamphetamine (ADDERALL) 20 MG tablet, Take 1 tablet (20 mg total) by mouth daily., Disp: 60 tablet, Rfl: 0   amphetamine-dextroamphetamine (ADDERALL) 20 MG tablet, Take 1 tablet (20 mg total) by mouth 2 (two) times daily., Disp: 60 tablet, Rfl: 0   azithromycin (ZITHROMAX) 250 MG tablet, Start with 2 tablets today, then 1 daily thereafter. (Patient not taking: No sig reported), Disp: 6 tablet, Rfl: 0   benzonatate (TESSALON) 100 MG capsule, Take 1-2 capsules (100-200 mg total) by mouth 3 (three) times daily as needed. (Patient not taking: No sig reported), Disp: 60 capsule, Rfl: 0    cetirizine (ZYRTEC) 10 MG tablet, Take 1 tablet (10 mg total) by mouth daily. (Patient not taking: No sig reported), Disp: 30 tablet, Rfl: 11   fluvoxaMINE (LUVOX) 100 MG tablet, TAKE 1 TABLET(100 MG) BY MOUTH AT BEDTIME, Disp: 30 tablet, Rfl: 5   HYDROcodone-homatropine (HYCODAN) 5-1.5 MG/5ML syrup, Take 5 mLs by mouth at bedtime as needed. (Patient not taking: No sig reported), Disp: 100 mL, Rfl: 0   Multiple Vitamins-Minerals (MULTIVITAMIN GUMMIES ADULT PO), Take 1 each by mouth daily. (Patient not taking: Reported on 03/10/2021), Disp: , Rfl:    ondansetron (ZOFRAN ODT) 4 MG disintegrating tablet, Take 1 tablet (4 mg total) by mouth every 8 (eight) hours as needed for nausea. (Patient not taking: Reported on 03/10/2021), Disp: 10 tablet, Rfl: 0   predniSONE (DELTASONE) 10 MG tablet, Take 4 tablets (40 mg total) by mouth daily with breakfast. (Patient not taking: No sig reported), Disp: 20 tablet, Rfl: 0   pseudoephedrine (SUDAFED 12 HOUR) 120 MG 12 hr tablet, Take 1 tablet (120 mg total) by mouth 2 (two) times daily. (Patient not taking: No sig reported), Disp: 30 tablet, Rfl: 3 Medication Side Effects: none  Family Medical/ Social History: Changes? No  MENTAL HEALTH EXAM:  There were no vitals taken for this visit.There is no height or weight on file to calculate BMI.  General Appearance: Casual and Neat  Eye Contact:  Fair  Speech:  Clear and Coherent and Pressured  Volume:  Normal  Mood:  Irritable  Affect:  Appropriate  Thought Process:  Coherent  Orientation:  Full (Time, Place, and Person)  Thought Content: Logical   Suicidal Thoughts:  No  Homicidal Thoughts:  No  Memory:  WNL  Judgement:  Good  Insight:  Good  Psychomotor Activity:  Normal  Concentration:  Concentration: Good  Recall:  Good  Fund of Knowledge: Good  Language: Good  Assets:  Desire for Improvement  ADL's:  Intact  Cognition: WNL  Prognosis:  Good    DIAGNOSES:    ICD-10-CM   1. Attention deficit  hyperactivity disorder (ADHD), combined type, moderate  F90.2     2. Mixed obsessional thoughts and acts  F42.2     3. Tobacco use disorder, moderate, dependence  F17.200       Receiving Psychotherapy: No    RECOMMENDATIONS:  Continue Adderall 20 mg tablet twice daily Will report any worsening symptoms or side effects promptly Patient will follow up in 3 months to reassess Discussed potential benefits, risks, and side effects of stimulants with patient to include increased heart rate, palpitations, insomnia, increased anxiety, increased irritability, or decreased appetite.  Instructed patient to contact office if experiencing any significant tolerability issues. Greater than 50% of 30 min. face to face time with patient was spent on counseling and coordination of care. We discussed smoking cessation. Reviewed importance of not waiting to late before seeking refills on weekend or holidays. Recommended psychotherapy. We discussed current problems with supply at [facility] of Adderall. He is upset they do not have supply of his particular generic made by [employer]. Says he can tell a diference and get headaches. PDMP reviewed.          Joan Flores, NP

## 2021-06-21 DIAGNOSIS — J029 Acute pharyngitis, unspecified: Secondary | ICD-10-CM | POA: Diagnosis not present

## 2021-09-01 ENCOUNTER — Other Ambulatory Visit: Payer: Self-pay

## 2021-09-01 ENCOUNTER — Encounter: Payer: Self-pay | Admitting: Behavioral Health

## 2021-09-01 ENCOUNTER — Ambulatory Visit: Payer: BC Managed Care – PPO | Admitting: Behavioral Health

## 2021-09-01 DIAGNOSIS — F902 Attention-deficit hyperactivity disorder, combined type: Secondary | ICD-10-CM | POA: Diagnosis not present

## 2021-09-01 DIAGNOSIS — F17200 Nicotine dependence, unspecified, uncomplicated: Secondary | ICD-10-CM

## 2021-09-01 DIAGNOSIS — F900 Attention-deficit hyperactivity disorder, predominantly inattentive type: Secondary | ICD-10-CM | POA: Diagnosis not present

## 2021-09-01 DIAGNOSIS — F422 Mixed obsessional thoughts and acts: Secondary | ICD-10-CM

## 2021-09-01 MED ORDER — AMPHETAMINE-DEXTROAMPHETAMINE 20 MG PO TABS: 20.0000 mg | ORAL_TABLET | Freq: Two times a day (BID) | ORAL | 0 refills | Status: DC

## 2021-09-01 NOTE — Progress Notes (Signed)
[facility]  Patient ID: Corey Allison,  MRN: [id number]  PCP: [facility] [place]  Date of Evaluation: 09/01/2021 Time spent:30 minutes  Chief Complaint:  Chief Complaint   Follow-up; Medication Refill; ADHD; OCD     HISTORY/CURRENT STATUS: HPI  30 year old male presents to this office for follow up and medication management. He is current doing well and says he is very productive at work. He has had no issues and lies the way the medication is working for him.  He does not want to make changes on dosage at this time.  He reports anxiety today at 2 and depression 0. He is sleeping 7-8 hours per night. No mania, no psychosis. No SI/H  No prior medication failures  Individual Medical History/ Review of Systems: Changes? :No   Allergies: Patient has no known allergies.  Current Medications:  Current Outpatient Medications:    albuterol (PROVENTIL HFA;VENTOLIN HFA) 108 (90 Base) MCG/ACT inhaler, Inhale 2 puffs into the lungs every 6 (six) hours as needed., Disp: 1 Inhaler, Rfl: 1   [START ON 10/01/2021] amphetamine-dextroamphetamine (ADDERALL) 20 MG tablet, Take 1 tablet (20 mg total) by mouth 2 (two) times daily., Disp: 60 tablet, Rfl: 0   fluvoxaMINE (LUVOX) 100 MG tablet, TAKE 1 TABLET(100 MG) BY MOUTH AT BEDTIME, Disp: 30 tablet, Rfl: 5   amphetamine-dextroamphetamine (ADDERALL) 20 MG tablet, Take 1 tablet (20 mg total) by mouth daily., Disp: 60 tablet, Rfl: 0   amphetamine-dextroamphetamine (ADDERALL) 20 MG tablet, Take 1 tablet (20 mg total) by mouth 2 (two) times daily., Disp: 60 tablet, Rfl: 0   amphetamine-dextroamphetamine (ADDERALL) 20 MG tablet, Take 1 tablet (20 mg total) by mouth 2 (two) times daily., Disp: 60 tablet, Rfl: 0   azithromycin (ZITHROMAX) 250 MG tablet, Start with 2 tablets today, then 1 daily thereafter. (Patient not taking: No sig reported), Disp: 6 tablet, Rfl: 0   benzonatate (TESSALON) 100 MG capsule, Take 1-2  capsules (100-200 mg total) by mouth 3 (three) times daily as needed. (Patient not taking: No sig reported), Disp: 60 capsule, Rfl: 0   cetirizine (ZYRTEC) 10 MG tablet, Take 1 tablet (10 mg total) by mouth daily. (Patient not taking: No sig reported), Disp: 30 tablet, Rfl: 11   HYDROcodone-homatropine (HYCODAN) 5-1.5 MG/5ML syrup, Take 5 mLs by mouth at bedtime as needed. (Patient not taking: No sig reported), Disp: 100 mL, Rfl: 0   Multiple Vitamins-Minerals (MULTIVITAMIN GUMMIES ADULT PO), Take 1 each by mouth daily. (Patient not taking: Reported on 03/10/2021), Disp: , Rfl:    ondansetron (ZOFRAN ODT) 4 MG disintegrating tablet, Take 1 tablet (4 mg total) by mouth every 8 (eight) hours as needed for nausea. (Patient not taking: Reported on 03/10/2021), Disp: 10 tablet, Rfl: 0   predniSONE (DELTASONE) 10 MG tablet, Take 4 tablets (40 mg total) by mouth daily with breakfast. (Patient not taking: No sig reported), Disp: 20 tablet, Rfl: 0   pseudoephedrine (SUDAFED 12 HOUR) 120 MG 12 hr tablet, Take 1 tablet (120 mg total) by mouth 2 (two) times daily. (Patient not taking: No sig reported), Disp: 30 tablet, Rfl: 3 Medication Side Effects: none  Family Medical/ Social History: Changes? No  MENTAL HEALTH EXAM:  There were no vitals taken for this visit.There is no height or weight on file to calculate BMI.  General Appearance: Casual and Neat  Eye Contact:  Good  Speech:  Clear and Coherent  Volume:  Normal  Mood:  NA  Affect:  Appropriate  Thought Process:  Coherent  Orientation:  Full (Time, Place, and Person)  Thought Content: Logical   Suicidal Thoughts:  No  Homicidal Thoughts:  No  Memory:  WNL  Judgement:  Good  Insight:  Good  Psychomotor Activity:  Normal  Concentration:  Concentration: Fair  Recall:  Good  Fund of Knowledge: Good  Language: Good  Assets:  Desire for Improvement  ADL's:  Intact  Cognition: WNL  Prognosis:  Good    DIAGNOSES:    ICD-10-CM   1. Attention  deficit hyperactivity disorder (ADHD), combined type, moderate  F90.2 amphetamine-dextroamphetamine (ADDERALL) 20 MG tablet    2. Mixed obsessional thoughts and acts  F42.2     3. Tobacco use disorder, moderate, dependence  F17.200     4. Attention deficit hyperactivity disorder (ADHD), predominantly inattentive type  F90.0 amphetamine-dextroamphetamine (ADDERALL) 20 MG tablet      Receiving Psychotherapy: No    RECOMMENDATIONS:   Continue Adderall 20 mg tablet twice daily Will report any worsening symptoms or side effects promptly Patient will follow up in 3 months to reassess Discussed potential benefits, risks, and side effects of stimulants with patient to include increased heart rate, palpitations, insomnia, increased anxiety, increased irritability, or decreased appetite.  Instructed patient to contact office if experiencing any significant tolerability issues. Greater than 50% of 30 min. face to face time with patient was spent on counseling and coordination of care. We discussed smoking cessation. Reviewed importance of not waiting to late before seeking refills on weekend or holidays. Recommended psychotherapy. We discussed current problems with supply at [facility] of Adderall. He is upset they do not have supply of his particular generic made by [employer]. Says he can tell a diference and get headaches. PDMP reviewed.              Joan Flores, NP

## 2021-09-06 ENCOUNTER — Other Ambulatory Visit: Payer: Self-pay | Admitting: Behavioral Health

## 2021-09-06 DIAGNOSIS — F3342 Major depressive disorder, recurrent, in full remission: Secondary | ICD-10-CM

## 2021-09-06 DIAGNOSIS — F422 Mixed obsessional thoughts and acts: Secondary | ICD-10-CM

## 2021-09-06 NOTE — Telephone Encounter (Signed)
Pt is out of medicine and he needs his luvox sent ASAP. He has been out and brian was suppose to have sent it in last week at his visit. He needs a call today letting him know that it was sent in. Please call him at [phone]

## 2021-10-05 ENCOUNTER — Telehealth: Payer: Self-pay | Admitting: Behavioral Health

## 2021-10-05 ENCOUNTER — Other Ambulatory Visit: Payer: Self-pay

## 2021-10-05 DIAGNOSIS — F3342 Major depressive disorder, recurrent, in full remission: Secondary | ICD-10-CM

## 2021-10-05 DIAGNOSIS — F422 Mixed obsessional thoughts and acts: Secondary | ICD-10-CM

## 2021-10-05 MED ORDER — FLUVOXAMINE MALEATE 100 MG PO TABS: ORAL_TABLET | ORAL | 0 refills | Status: DC

## 2021-10-05 NOTE — Telephone Encounter (Signed)
Pt called and said that he is out of luvox. He was told he could have a 90 day supply sent in . [facility] [address].

## 2021-10-05 NOTE — Telephone Encounter (Signed)
Rx sent 

## 2021-11-17 DIAGNOSIS — L0591 Pilonidal cyst without abscess: Secondary | ICD-10-CM | POA: Diagnosis not present

## 2021-11-17 DIAGNOSIS — K611 Rectal abscess: Secondary | ICD-10-CM | POA: Diagnosis not present

## 2021-11-30 ENCOUNTER — Ambulatory Visit: Payer: BC Managed Care – PPO | Admitting: Behavioral Health

## 2021-11-30 ENCOUNTER — Other Ambulatory Visit: Payer: Self-pay

## 2021-11-30 ENCOUNTER — Encounter: Payer: Self-pay | Admitting: Behavioral Health

## 2021-11-30 DIAGNOSIS — F902 Attention-deficit hyperactivity disorder, combined type: Secondary | ICD-10-CM | POA: Diagnosis not present

## 2021-11-30 DIAGNOSIS — F3342 Major depressive disorder, recurrent, in full remission: Secondary | ICD-10-CM | POA: Diagnosis not present

## 2021-11-30 DIAGNOSIS — L0591 Pilonidal cyst without abscess: Secondary | ICD-10-CM | POA: Diagnosis not present

## 2021-11-30 DIAGNOSIS — F422 Mixed obsessional thoughts and acts: Secondary | ICD-10-CM | POA: Diagnosis not present

## 2021-11-30 DIAGNOSIS — F17200 Nicotine dependence, unspecified, uncomplicated: Secondary | ICD-10-CM | POA: Diagnosis not present

## 2021-11-30 MED ORDER — AMPHETAMINE-DEXTROAMPHETAMINE 20 MG PO TABS: 20.0000 mg | ORAL_TABLET | Freq: Two times a day (BID) | ORAL | 0 refills | Status: DC

## 2021-11-30 MED ORDER — FLUVOXAMINE MALEATE 100 MG PO TABS: ORAL_TABLET | ORAL | 2 refills | Status: DC

## 2021-11-30 NOTE — Progress Notes (Signed)
[facility] ? ?Patient ID: Corey Allison,  ?MRN: [id number] ? ?PCP: [facility] [place] ? ?Date of Evaluation: 11/30/2021 ?Time spent:30 minutes ? ?Chief Complaint:  ?Chief Complaint   ?Depression; Follow-up; Medication Refill; ADHD; OCD ?  ? ? ?HISTORY/CURRENT STATUS: ?HPI ?31 year old male presents to this office for follow up and medication management. He is current doing well and says he is very productive at work. No changes in his social life. He has had no issues and likes the way the medication is working for him.  He does not want to make changes on dosage at this time.  He reports anxiety today at 2 and depression 0. He is sleeping 7-8 hours per night. No mania, no psychosis. No SI/H ?  ?No prior medication failures ? ? ?Individual Medical History/ Review of Systems: Changes? :No  ? ?Allergies: Patient has no known allergies. ? ?Current Medications:  ?Current Outpatient Medications:  ?  [START ON 12/29/2021] amphetamine-dextroamphetamine (ADDERALL) 20 MG tablet, Take 1 tablet (20 mg total) by mouth 2 (two) times daily., Disp: 60 tablet, Rfl: 0 ?  [START ON 01/28/2022] amphetamine-dextroamphetamine (ADDERALL) 20 MG tablet, Take 1 tablet (20 mg total) by mouth 2 (two) times daily., Disp: 60 tablet, Rfl: 0 ?  albuterol (PROVENTIL HFA;VENTOLIN HFA) 108 (90 Base) MCG/ACT inhaler, Inhale 2 puffs into the lungs every 6 (six) hours as needed., Disp: 1 Inhaler, Rfl: 1 ?  amphetamine-dextroamphetamine (ADDERALL) 20 MG tablet, Take 1 tablet (20 mg total) by mouth daily., Disp: 60 tablet, Rfl: 0 ?  amphetamine-dextroamphetamine (ADDERALL) 20 MG tablet, Take 1 tablet (20 mg total) by mouth 2 (two) times daily., Disp: 60 tablet, Rfl: 0 ?  amphetamine-dextroamphetamine (ADDERALL) 20 MG tablet, Take 1 tablet (20 mg total) by mouth 2 (two) times daily., Disp: 60 tablet, Rfl: 0 ?  amphetamine-dextroamphetamine (ADDERALL) 20 MG tablet, Take 1 tablet (20 mg total) by mouth 2 (two) times  daily., Disp: 60 tablet, Rfl: 0 ?  azithromycin (ZITHROMAX) 250 MG tablet, Start with 2 tablets today, then 1 daily thereafter. (Patient not taking: No sig reported), Disp: 6 tablet, Rfl: 0 ?  benzonatate (TESSALON) 100 MG capsule, Take 1-2 capsules (100-200 mg total) by mouth 3 (three) times daily as needed. (Patient not taking: No sig reported), Disp: 60 capsule, Rfl: 0 ?  cetirizine (ZYRTEC) 10 MG tablet, Take 1 tablet (10 mg total) by mouth daily. (Patient not taking: No sig reported), Disp: 30 tablet, Rfl: 11 ?  fluvoxaMINE (LUVOX) 100 MG tablet, TAKE 1 TABLET(100 MG) BY MOUTH AT BEDTIME, Disp: 90 tablet, Rfl: 2 ?  HYDROcodone-homatropine (HYCODAN) 5-1.5 MG/5ML syrup, Take 5 mLs by mouth at bedtime as needed. (Patient not taking: No sig reported), Disp: 100 mL, Rfl: 0 ?  Multiple Vitamins-Minerals (MULTIVITAMIN GUMMIES ADULT PO), Take 1 each by mouth daily. (Patient not taking: Reported on 03/10/2021), Disp: , Rfl:  ?  ondansetron (ZOFRAN ODT) 4 MG disintegrating tablet, Take 1 tablet (4 mg total) by mouth every 8 (eight) hours as needed for nausea. (Patient not taking: Reported on 03/10/2021), Disp: 10 tablet, Rfl: 0 ?  predniSONE (DELTASONE) 10 MG tablet, Take 4 tablets (40 mg total) by mouth daily with breakfast. (Patient not taking: No sig reported), Disp: 20 tablet, Rfl: 0 ?  pseudoephedrine (SUDAFED 12 HOUR) 120 MG 12 hr tablet, Take 1 tablet (120 mg total) by mouth 2 (two) times daily. (Patient not taking: No sig reported), Disp: 30 tablet, Rfl: 3 ?Medication Side Effects: none ? ?  Family Medical/ Social History: Changes? No ? ?MENTAL HEALTH EXAM: ? ?There were no vitals taken for this visit.There is no height or weight on file to calculate BMI.  ?General Appearance: Casual  ?Eye Contact:  Good  ?Speech:  Clear and Coherent  ?Volume:  Normal  ?Mood:  NA  ?Affect:  Appropriate  ?Thought Process:  Coherent  ?Orientation:  Full (Time, Place, and Person)  ?Thought Content: Logical   ?Suicidal Thoughts:  No   ?Homicidal Thoughts:  No  ?Memory:  WNL  ?Judgement:  Good  ?Insight:  Good  ?Psychomotor Activity:  Normal  ?Concentration:  Concentration: Good  ?Recall:  Good  ?Fund of Knowledge: Good  ?Language: Good  ?Assets:  Desire for Improvement  ?ADL's:  Intact  ?Cognition: WNL  ?Prognosis:  Good  ? ? ?DIAGNOSES:  ?  ICD-10-CM   ?1. Tobacco use disorder, moderate, dependence  F17.200   ?  ?2. Mixed obsessional thoughts and acts  F42.2 fluvoxaMINE (LUVOX) 100 MG tablet  ?  ?3. Major depression, recurrent, full remission (HCC)  F33.42 fluvoxaMINE (LUVOX) 100 MG tablet  ?  ?4. Attention deficit hyperactivity disorder (ADHD), combined type, moderate  F90.2 amphetamine-dextroamphetamine (ADDERALL) 20 MG tablet  ?  amphetamine-dextroamphetamine (ADDERALL) 20 MG tablet  ?  amphetamine-dextroamphetamine (ADDERALL) 20 MG tablet  ?  ? ? ?Receiving Psychotherapy: No  ? ? ?RECOMMENDATIONS:  ? ?Greater than 50% of 30 min. face to face time with patient was spent on counseling and coordination of care. We discussed smoking cessation. Reviewed importance of not waiting to late before seeking refills on weekend or holidays. Recommended psychotherapy. He has had no issues obtaining his medication recently. ? ?Continue Adderall 20 mg tablet twice daily ?Will report any worsening symptoms or side effects promptly ?Patient will follow up in 3 months to reassess ?Provided emergency contact information ?Discussed potential benefits, risks, and side effects of stimulants with patient to include increased heart rate, palpitations, insomnia, increased anxiety, increased irritability, or decreased appetite.  Instructed patient to contact office if experiencing any significant tolerability issues. ? ?PDMP reviewed.  ? ? ? ?Joan Flores, NP  ?

## 2021-12-30 ENCOUNTER — Other Ambulatory Visit: Payer: Self-pay | Admitting: Behavioral Health

## 2021-12-30 ENCOUNTER — Telehealth: Payer: Self-pay | Admitting: Behavioral Health

## 2021-12-30 DIAGNOSIS — F422 Mixed obsessional thoughts and acts: Secondary | ICD-10-CM

## 2021-12-30 DIAGNOSIS — F3342 Major depressive disorder, recurrent, in full remission: Secondary | ICD-10-CM

## 2021-12-30 MED ORDER — FLUVOXAMINE MALEATE 100 MG PO TABS: ORAL_TABLET | ORAL | 2 refills | Status: DC

## 2021-12-30 NOTE — Telephone Encounter (Signed)
Patient called in for refill on Fluvoxamine 100mg . Ph:[phone]. Appt 6/1. Pharmacy [facility] [address] [place] ?

## 2021-12-30 NOTE — Telephone Encounter (Signed)
Rx sent to [facility]

## 2022-03-02 ENCOUNTER — Ambulatory Visit: Payer: BC Managed Care – PPO | Admitting: Behavioral Health

## 2022-04-18 ENCOUNTER — Other Ambulatory Visit: Payer: Self-pay

## 2022-04-18 ENCOUNTER — Telehealth: Payer: Self-pay | Admitting: Behavioral Health

## 2022-04-18 DIAGNOSIS — F900 Attention-deficit hyperactivity disorder, predominantly inattentive type: Secondary | ICD-10-CM

## 2022-04-18 MED ORDER — AMPHETAMINE-DEXTROAMPHETAMINE 20 MG PO TABS: 20.0000 mg | ORAL_TABLET | Freq: Every day | ORAL | 0 refills | Status: DC

## 2022-04-18 NOTE — Telephone Encounter (Signed)
Next appt is 04/20/22. Corey Allison is requesting a refill on his Adderall 20 mg called to:  [id number] [place], [place] - [address] AT [facility] [address]  Phone:  [phone]  Fax:  [phone]   Per patient it is in stock at this location.

## 2022-04-18 NOTE — Telephone Encounter (Signed)
Pended.

## 2022-04-20 ENCOUNTER — Encounter: Payer: Self-pay | Admitting: Behavioral Health

## 2022-04-20 ENCOUNTER — Ambulatory Visit (INDEPENDENT_AMBULATORY_CARE_PROVIDER_SITE_OTHER): Payer: 59 | Admitting: Behavioral Health

## 2022-04-20 DIAGNOSIS — F3342 Major depressive disorder, recurrent, in full remission: Secondary | ICD-10-CM | POA: Diagnosis not present

## 2022-04-20 DIAGNOSIS — F422 Mixed obsessional thoughts and acts: Secondary | ICD-10-CM

## 2022-04-20 DIAGNOSIS — F900 Attention-deficit hyperactivity disorder, predominantly inattentive type: Secondary | ICD-10-CM

## 2022-04-20 DIAGNOSIS — F902 Attention-deficit hyperactivity disorder, combined type: Secondary | ICD-10-CM

## 2022-04-20 MED ORDER — AMPHETAMINE-DEXTROAMPHETAMINE 20 MG PO TABS: 20.0000 mg | ORAL_TABLET | Freq: Two times a day (BID) | ORAL | 0 refills | Status: DC

## 2022-04-20 NOTE — Progress Notes (Signed)
[facility]  Patient ID: Strider Vallance,  MRN: [id number]  PCP: [facility] [place]  Date of Evaluation: 04/20/2022 Time spent:20 minutes  Chief Complaint:  Chief Complaint   Depression; Anxiety; Follow-up; Medication Refill; Patient Education; ADHD     HISTORY/CURRENT STATUS: HPI  31 year old male presents to this office for follow up and medication management. No changes this visit.  He is current doing well and says he is very productive at work. No changes in his social life. He has had no issues and likes the way the medication is working for him.  He does not want to make changes on dosage at this time.  He reports anxiety today at 2 and depression 0. He is sleeping 7-8 hours per night. No mania, no psychosis. No SI/H   No prior medication failures      Individual Medical History/ Review of Systems: Changes? :No   Allergies: Patient has no known allergies.  Current Medications:  Current Outpatient Medications:    albuterol (PROVENTIL HFA;VENTOLIN HFA) 108 (90 Base) MCG/ACT inhaler, Inhale 2 puffs into the lungs every 6 (six) hours as needed., Disp: 1 Inhaler, Rfl: 1   amphetamine-dextroamphetamine (ADDERALL) 20 MG tablet, Take 1 tablet (20 mg total) by mouth 2 (two) times daily., Disp: 60 tablet, Rfl: 0   amphetamine-dextroamphetamine (ADDERALL) 20 MG tablet, Take 1 tablet (20 mg total) by mouth 2 (two) times daily., Disp: 60 tablet, Rfl: 0   amphetamine-dextroamphetamine (ADDERALL) 20 MG tablet, Take 1 tablet (20 mg total) by mouth 2 (two) times daily., Disp: 60 tablet, Rfl: 0   amphetamine-dextroamphetamine (ADDERALL) 20 MG tablet, Take 1 tablet (20 mg total) by mouth 2 (two) times daily., Disp: 60 tablet, Rfl: 0   amphetamine-dextroamphetamine (ADDERALL) 20 MG tablet, Take 1 tablet (20 mg total) by mouth daily., Disp: 30 tablet, Rfl: 0   [START ON 05/19/2022] amphetamine-dextroamphetamine (ADDERALL) 20 MG tablet, Take 1 tablet (20 mg  total) by mouth 2 (two) times daily., Disp: 60 tablet, Rfl: 0   azithromycin (ZITHROMAX) 250 MG tablet, Start with 2 tablets today, then 1 daily thereafter. (Patient not taking: No sig reported), Disp: 6 tablet, Rfl: 0   benzonatate (TESSALON) 100 MG capsule, Take 1-2 capsules (100-200 mg total) by mouth 3 (three) times daily as needed. (Patient not taking: No sig reported), Disp: 60 capsule, Rfl: 0   cetirizine (ZYRTEC) 10 MG tablet, Take 1 tablet (10 mg total) by mouth daily. (Patient not taking: No sig reported), Disp: 30 tablet, Rfl: 11   fluvoxaMINE (LUVOX) 100 MG tablet, TAKE 1 TABLET(100 MG) BY MOUTH AT BEDTIME, Disp: 90 tablet, Rfl: 2   HYDROcodone-homatropine (HYCODAN) 5-1.5 MG/5ML syrup, Take 5 mLs by mouth at bedtime as needed. (Patient not taking: No sig reported), Disp: 100 mL, Rfl: 0   Multiple Vitamins-Minerals (MULTIVITAMIN GUMMIES ADULT PO), Take 1 each by mouth daily. (Patient not taking: Reported on 03/10/2021), Disp: , Rfl:    ondansetron (ZOFRAN ODT) 4 MG disintegrating tablet, Take 1 tablet (4 mg total) by mouth every 8 (eight) hours as needed for nausea. (Patient not taking: Reported on 03/10/2021), Disp: 10 tablet, Rfl: 0   predniSONE (DELTASONE) 10 MG tablet, Take 4 tablets (40 mg total) by mouth daily with breakfast. (Patient not taking: No sig reported), Disp: 20 tablet, Rfl: 0   pseudoephedrine (SUDAFED 12 HOUR) 120 MG 12 hr tablet, Take 1 tablet (120 mg total) by mouth 2 (two) times daily. (Patient not taking: No sig reported), Disp:  30 tablet, Rfl: 3 Medication Side Effects: none  Family Medical/ Social History: Changes? No  MENTAL HEALTH EXAM:  There were no vitals taken for this visit.There is no height or weight on file to calculate BMI.  General Appearance: Casual, Neat, and Well Groomed  Eye Contact:  Good  Speech:  Clear and Coherent  Volume:  Normal  Mood:  Angry  Affect:  Appropriate  Thought Process:  Coherent  Orientation:  Full (Time, Place, and Person)   Thought Content: Logical   Suicidal Thoughts:  No  Homicidal Thoughts:  No  Memory:  WNL  Judgement:  Good  Insight:  Good  Psychomotor Activity:  Normal  Concentration:  Concentration: Good  Recall:  Good  Fund of Knowledge: Good  Language: Good  Assets:  Desire for Improvement  ADL's:  Intact  Cognition: WNL  Prognosis:  Good    DIAGNOSES:    ICD-10-CM   1. Attention deficit hyperactivity disorder (ADHD), predominantly inattentive type  F90.0 amphetamine-dextroamphetamine (ADDERALL) 20 MG tablet    2. Mixed obsessional thoughts and acts  F42.2     3. Major depression, recurrent, full remission (HCC)  F33.42     4. Attention deficit hyperactivity disorder (ADHD), combined type, moderate  F90.2       Receiving Psychotherapy: No    RECOMMENDATIONS:   Greater than 50% of 30 min. face to face time with patient was spent on counseling and coordination of care. We discussed smoking cessation. Reviewed importance of not waiting to late before seeking refills on weekend or holidays. Recommended psychotherapy. He has had no issues obtaining his medication recently.   Continue Adderall 20 mg tablet twice daily Will report any worsening symptoms or side effects promptly Patient will follow up in 3 months to reassess Provided emergency contact information Discussed potential benefits, risks, and side effects of stimulants with patient to include increased heart rate, palpitations, insomnia, increased anxiety, increased irritability, or decreased appetite.  Instructed patient to contact office if experiencing any significant tolerability issues.   PDMP reviewed.           Joan Flores, NP

## 2022-05-11 ENCOUNTER — Other Ambulatory Visit: Payer: Self-pay

## 2022-05-11 ENCOUNTER — Telehealth: Payer: Self-pay | Admitting: Behavioral Health

## 2022-05-11 DIAGNOSIS — F900 Attention-deficit hyperactivity disorder, predominantly inattentive type: Secondary | ICD-10-CM

## 2022-05-11 MED ORDER — AMPHETAMINE-DEXTROAMPHETAMINE 20 MG PO TABS: 20.0000 mg | ORAL_TABLET | Freq: Two times a day (BID) | ORAL | 0 refills | Status: DC

## 2022-05-11 NOTE — Telephone Encounter (Signed)
Next visit is 07/21/22.Corey Allison said the last refill of Adderall 20 mg he got was for 30 pills but he states he has always gotten #60 pills. He is almost out of Adderall now as he takes two a day instead of one pill. Could someone please call him at [phone] and let him know why he can't get #60 pills now?

## 2022-05-11 NOTE — Telephone Encounter (Signed)
Pended.

## 2022-06-27 ENCOUNTER — Other Ambulatory Visit: Payer: Self-pay

## 2022-06-27 DIAGNOSIS — F900 Attention-deficit hyperactivity disorder, predominantly inattentive type: Secondary | ICD-10-CM

## 2022-06-27 MED ORDER — AMPHETAMINE-DEXTROAMPHETAMINE 20 MG PO TABS: 20.0000 mg | ORAL_TABLET | Freq: Two times a day (BID) | ORAL | 0 refills | Status: DC

## 2022-06-27 NOTE — Telephone Encounter (Signed)
Pt came in this morning to pick up a paper Rx for Amphetamine-Dextroamphetamine 20 mg bid, he said as of 8 pm last night [facility] [address] had it in stock.   Last refill #60 on 05/11/22  Will print out Rx and get Deloria Lair, NP to sign for Aaron Edelman, he is out of office this morning.

## 2022-07-21 ENCOUNTER — Ambulatory Visit (INDEPENDENT_AMBULATORY_CARE_PROVIDER_SITE_OTHER): Payer: 59 | Admitting: Behavioral Health

## 2022-07-21 ENCOUNTER — Encounter: Payer: Self-pay | Admitting: Behavioral Health

## 2022-07-21 DIAGNOSIS — F422 Mixed obsessional thoughts and acts: Secondary | ICD-10-CM

## 2022-07-21 DIAGNOSIS — F900 Attention-deficit hyperactivity disorder, predominantly inattentive type: Secondary | ICD-10-CM

## 2022-07-21 DIAGNOSIS — F3342 Major depressive disorder, recurrent, in full remission: Secondary | ICD-10-CM | POA: Diagnosis not present

## 2022-07-21 MED ORDER — AMPHETAMINE-DEXTROAMPHETAMINE 20 MG PO TABS: 20.0000 mg | ORAL_TABLET | Freq: Two times a day (BID) | ORAL | 0 refills | Status: DC

## 2022-07-21 NOTE — Progress Notes (Signed)
[facility]  Patient ID: Corey Allison,  MRN: [id number]  PCP: [facility] [place]  Date of Evaluation: 07/21/2022 Time spent:30 minutes  Chief Complaint:  Chief Complaint   Anxiety; ADHD; Medication Refill; Follow-up; Patient Education     HISTORY/CURRENT STATUS: HPI  31 year old male presents to this office for follow up and medication management. No changes this visit.  He is current doing well and says he is very productive at work. No changes in his social life. Says he is still single and not looking.  He has had no issues and likes the way the medication is working for him.  He does not want to make changes on dosage at this time.  He reports anxiety today at 2 and depression 0. He is sleeping 7-8 hours per night. No mania, no psychosis. No SI/H   No prior medication failures    Individual Medical History/ Review of Systems: Changes? :No   Allergies: Patient has no known allergies.  Current Medications:  Current Outpatient Medications:    albuterol (PROVENTIL HFA;VENTOLIN HFA) 108 (90 Base) MCG/ACT inhaler, Inhale 2 puffs into the lungs every 6 (six) hours as needed., Disp: 1 Inhaler, Rfl: 1   amphetamine-dextroamphetamine (ADDERALL) 20 MG tablet, Take 1 tablet (20 mg total) by mouth 2 (two) times daily., Disp: 60 tablet, Rfl: 0   amphetamine-dextroamphetamine (ADDERALL) 20 MG tablet, Take 1 tablet (20 mg total) by mouth 2 (two) times daily., Disp: 60 tablet, Rfl: 0   amphetamine-dextroamphetamine (ADDERALL) 20 MG tablet, Take 1 tablet (20 mg total) by mouth 2 (two) times daily., Disp: 60 tablet, Rfl: 0   amphetamine-dextroamphetamine (ADDERALL) 20 MG tablet, Take 1 tablet (20 mg total) by mouth 2 (two) times daily., Disp: 60 tablet, Rfl: 0   [START ON 07/26/2022] amphetamine-dextroamphetamine (ADDERALL) 20 MG tablet, Take 1 tablet (20 mg total) by mouth 2 (two) times daily., Disp: 60 tablet, Rfl: 0   azithromycin (ZITHROMAX) 250 MG  tablet, Start with 2 tablets today, then 1 daily thereafter. (Patient not taking: No sig reported), Disp: 6 tablet, Rfl: 0   benzonatate (TESSALON) 100 MG capsule, Take 1-2 capsules (100-200 mg total) by mouth 3 (three) times daily as needed. (Patient not taking: No sig reported), Disp: 60 capsule, Rfl: 0   cetirizine (ZYRTEC) 10 MG tablet, Take 1 tablet (10 mg total) by mouth daily. (Patient not taking: No sig reported), Disp: 30 tablet, Rfl: 11   fluvoxaMINE (LUVOX) 100 MG tablet, TAKE 1 TABLET(100 MG) BY MOUTH AT BEDTIME, Disp: 90 tablet, Rfl: 2   HYDROcodone-homatropine (HYCODAN) 5-1.5 MG/5ML syrup, Take 5 mLs by mouth at bedtime as needed. (Patient not taking: No sig reported), Disp: 100 mL, Rfl: 0   Multiple Vitamins-Minerals (MULTIVITAMIN GUMMIES ADULT PO), Take 1 each by mouth daily. (Patient not taking: Reported on 03/10/2021), Disp: , Rfl:    ondansetron (ZOFRAN ODT) 4 MG disintegrating tablet, Take 1 tablet (4 mg total) by mouth every 8 (eight) hours as needed for nausea. (Patient not taking: Reported on 03/10/2021), Disp: 10 tablet, Rfl: 0   predniSONE (DELTASONE) 10 MG tablet, Take 4 tablets (40 mg total) by mouth daily with breakfast. (Patient not taking: No sig reported), Disp: 20 tablet, Rfl: 0   pseudoephedrine (SUDAFED 12 HOUR) 120 MG 12 hr tablet, Take 1 tablet (120 mg total) by mouth 2 (two) times daily. (Patient not taking: No sig reported), Disp: 30 tablet, Rfl: 3 Medication Side Effects: none  Family Medical/ Social History: Changes? No  MENTAL HEALTH EXAM:  There were no vitals taken for this visit.There is no height or weight on file to calculate BMI.  General Appearance: Casual, Neat, and Well Groomed  Eye Contact:  Good  Speech:  Clear and Coherent  Volume:  Normal  Mood:  Anxious and Depressed  Affect:  Appropriate  Thought Process:  Coherent  Orientation:  Full (Time, Place, and Person)  Thought Content: Logical   Suicidal Thoughts:  No  Homicidal Thoughts:  No   Memory:  WNL  Judgement:  Good  Insight:  Good  Psychomotor Activity:  Normal  Concentration:  Concentration: Good  Recall:  Good  Fund of Knowledge: Good  Language: Good  Assets:  Desire for Improvement  ADL's:  Intact  Cognition: WNL  Prognosis:  Good    DIAGNOSES:    ICD-10-CM   1. Attention deficit hyperactivity disorder (ADHD), predominantly inattentive type  F90.0 amphetamine-dextroamphetamine (ADDERALL) 20 MG tablet    2. Mixed obsessional thoughts and acts  F42.2     3. Major depression, recurrent, full remission ([place])  F33.42       Receiving Psychotherapy: No    RECOMMENDATIONS:   Greater than 50% of 30 min. face to face time with patient was spent on counseling and coordination of care.No changes this visit. Reviewed importance of not waiting to late before seeking refills on weekend or holidays. Recommended psychotherapy. He has had no issues obtaining his medication recently.   Continue Adderall 20 mg tablet twice daily Will report any worsening symptoms or side effects promptly Patient will follow up in 3 months to reassess Provided emergency contact information Discussed potential benefits, risks, and side effects of stimulants with patient to include increased heart rate, palpitations, insomnia, increased anxiety, increased irritability, or decreased appetite.  Instructed patient to contact office if experiencing any significant tolerability issues.   PDMP reviewed.   Elwanda Brooklyn, NP

## 2022-09-28 ENCOUNTER — Other Ambulatory Visit: Payer: Self-pay

## 2022-09-28 ENCOUNTER — Telehealth: Payer: Self-pay | Admitting: Behavioral Health

## 2022-09-28 DIAGNOSIS — F900 Attention-deficit hyperactivity disorder, predominantly inattentive type: Secondary | ICD-10-CM

## 2022-09-28 MED ORDER — AMPHETAMINE-DEXTROAMPHETAMINE 20 MG PO TABS: 20.0000 mg | ORAL_TABLET | Freq: Two times a day (BID) | ORAL | 0 refills | Status: DC

## 2022-09-28 NOTE — Telephone Encounter (Signed)
Pended.

## 2022-09-28 NOTE — Telephone Encounter (Signed)
Pt called and needs a refill for his adderall 20 mg. Pharmacy is [facility] on [address]. Next appt is 10/16/22

## 2022-10-19 ENCOUNTER — Encounter: Payer: Self-pay | Admitting: Behavioral Health

## 2022-10-19 ENCOUNTER — Ambulatory Visit: Payer: 59 | Admitting: Behavioral Health

## 2022-10-19 DIAGNOSIS — F422 Mixed obsessional thoughts and acts: Secondary | ICD-10-CM | POA: Diagnosis not present

## 2022-10-19 DIAGNOSIS — F3342 Major depressive disorder, recurrent, in full remission: Secondary | ICD-10-CM

## 2022-10-19 DIAGNOSIS — F900 Attention-deficit hyperactivity disorder, predominantly inattentive type: Secondary | ICD-10-CM

## 2022-10-19 MED ORDER — AMPHETAMINE-DEXTROAMPHETAMINE 20 MG PO TABS: 20.0000 mg | ORAL_TABLET | Freq: Two times a day (BID) | ORAL | 0 refills | Status: DC

## 2022-10-19 MED ORDER — FLUVOXAMINE MALEATE 100 MG PO TABS: ORAL_TABLET | ORAL | 2 refills | Status: DC

## 2022-10-19 NOTE — Progress Notes (Signed)
[facility]  Patient ID: Corey Allison,  MRN: [id number]  PCP: [facility] [place]  Date of Evaluation: 10/19/2022 Time spent:30 minutes  Chief Complaint:  Chief Complaint   ADHD; Patient Education; Medication Refill; Follow-up     HISTORY/CURRENT STATUS: HPI  32 year old male presents to this office for follow up and medication management. No changes this visit.  He is current doing well and says he is very productive at work. No changes in his social life. Says he is still single and not looking.  He has had no issues and likes the way the medication is working for him.  He does not want to make changes on dosage at this time.  He reports anxiety today at 2 and depression 0. He is sleeping 7-8 hours per night. No mania, no psychosis. No SI/H   No prior medication failures       Individual Medical History/ Review of Systems: Changes? :No   Allergies: Patient has no known allergies.  Current Medications:  Current Outpatient Medications:    albuterol (PROVENTIL HFA;VENTOLIN HFA) 108 (90 Base) MCG/ACT inhaler, Inhale 2 puffs into the lungs every 6 (six) hours as needed., Disp: 1 Inhaler, Rfl: 1   amphetamine-dextroamphetamine (ADDERALL) 20 MG tablet, Take 1 tablet (20 mg total) by mouth 2 (two) times daily., Disp: 60 tablet, Rfl: 0   amphetamine-dextroamphetamine (ADDERALL) 20 MG tablet, Take 1 tablet (20 mg total) by mouth 2 (two) times daily., Disp: 60 tablet, Rfl: 0   amphetamine-dextroamphetamine (ADDERALL) 20 MG tablet, Take 1 tablet (20 mg total) by mouth 2 (two) times daily., Disp: 60 tablet, Rfl: 0   amphetamine-dextroamphetamine (ADDERALL) 20 MG tablet, Take 1 tablet (20 mg total) by mouth 2 (two) times daily., Disp: 60 tablet, Rfl: 0   [START ON 10/28/2022] amphetamine-dextroamphetamine (ADDERALL) 20 MG tablet, Take 1 tablet (20 mg total) by mouth 2 (two) times daily., Disp: 60 tablet, Rfl: 0   azithromycin (ZITHROMAX) 250 MG tablet,  Start with 2 tablets today, then 1 daily thereafter. (Patient not taking: No sig reported), Disp: 6 tablet, Rfl: 0   benzonatate (TESSALON) 100 MG capsule, Take 1-2 capsules (100-200 mg total) by mouth 3 (three) times daily as needed. (Patient not taking: No sig reported), Disp: 60 capsule, Rfl: 0   cetirizine (ZYRTEC) 10 MG tablet, Take 1 tablet (10 mg total) by mouth daily. (Patient not taking: No sig reported), Disp: 30 tablet, Rfl: 11   fluvoxaMINE (LUVOX) 100 MG tablet, TAKE 1 TABLET(100 MG) BY MOUTH AT BEDTIME, Disp: 90 tablet, Rfl: 2   HYDROcodone-homatropine (HYCODAN) 5-1.5 MG/5ML syrup, Take 5 mLs by mouth at bedtime as needed. (Patient not taking: No sig reported), Disp: 100 mL, Rfl: 0   Multiple Vitamins-Minerals (MULTIVITAMIN GUMMIES ADULT PO), Take 1 each by mouth daily. (Patient not taking: Reported on 03/10/2021), Disp: , Rfl:    ondansetron (ZOFRAN ODT) 4 MG disintegrating tablet, Take 1 tablet (4 mg total) by mouth every 8 (eight) hours as needed for nausea. (Patient not taking: Reported on 03/10/2021), Disp: 10 tablet, Rfl: 0   predniSONE (DELTASONE) 10 MG tablet, Take 4 tablets (40 mg total) by mouth daily with breakfast. (Patient not taking: No sig reported), Disp: 20 tablet, Rfl: 0   pseudoephedrine (SUDAFED 12 HOUR) 120 MG 12 hr tablet, Take 1 tablet (120 mg total) by mouth 2 (two) times daily. (Patient not taking: No sig reported), Disp: 30 tablet, Rfl: 3 Medication Side Effects: none  Family Medical/ Social History:  Changes? No  MENTAL HEALTH EXAM:  There were no vitals taken for this visit.There is no height or weight on file to calculate BMI.  General Appearance: Casual, Neat, and Well Groomed  Eye Contact:  Good  Speech:  Clear and Coherent  Volume:  Normal  Mood:  NA  Affect:  Appropriate  Thought Process:  Coherent  Orientation:  Full (Time, Place, and Person)  Thought Content: Logical   Suicidal Thoughts:  No  Homicidal Thoughts:  No  Memory:  WNL  Judgement:   Good  Insight:  Good  Psychomotor Activity:  Normal  Concentration:  Concentration: Good  Recall:  Good  Fund of Knowledge: Good  Language: Good  Assets:  Desire for Improvement  ADL's:  Intact  Cognition: WNL  Prognosis:  Good    DIAGNOSES:    ICD-10-CM   1. Attention deficit hyperactivity disorder (ADHD), predominantly inattentive type  F90.0 amphetamine-dextroamphetamine (ADDERALL) 20 MG tablet    2. Major depression, recurrent, full remission (HCC)  F33.42 fluvoxaMINE (LUVOX) 100 MG tablet    3. Mixed obsessional thoughts and acts  F42.2 fluvoxaMINE (LUVOX) 100 MG tablet      Receiving Psychotherapy: No    RECOMMENDATIONS:   Greater than 50% of 30 min. face to face time with patient was spent on counseling and coordination of care. No changes this visit. Reviewed importance of not waiting to late before seeking refills on weekend or holidays. Recommended psychotherapy. He has had no issues obtaining his medication recently.   Continue Adderall 20 mg tablet twice daily Will report any worsening symptoms or side effects promptly Patient will follow up in 3 months to reassess Provided emergency contact information Discussed potential benefits, risks, and side effects of stimulants with patient to include increased heart rate, palpitations, insomnia, increased anxiety, increased irritability, or decreased appetite.  Instructed patient to contact office if experiencing any significant tolerability issues.   PDMP reviewed.       Elwanda Brooklyn, NP

## 2022-12-16 ENCOUNTER — Other Ambulatory Visit: Payer: Self-pay | Admitting: Behavioral Health

## 2022-12-16 DIAGNOSIS — F3342 Major depressive disorder, recurrent, in full remission: Secondary | ICD-10-CM

## 2022-12-16 DIAGNOSIS — F422 Mixed obsessional thoughts and acts: Secondary | ICD-10-CM

## 2022-12-21 ENCOUNTER — Telehealth: Payer: Self-pay | Admitting: Behavioral Health

## 2022-12-21 ENCOUNTER — Other Ambulatory Visit: Payer: Self-pay | Admitting: Behavioral Health

## 2022-12-21 DIAGNOSIS — F902 Attention-deficit hyperactivity disorder, combined type: Secondary | ICD-10-CM

## 2022-12-21 MED ORDER — AMPHETAMINE-DEXTROAMPHETAMINE 20 MG PO TABS: 20.0000 mg | ORAL_TABLET | Freq: Two times a day (BID) | ORAL | 0 refills | Status: DC

## 2022-12-21 NOTE — Telephone Encounter (Signed)
Pt called and needs a refill on his adderall 20 mg. Pharmacy is [facility] on [address]

## 2023-01-30 ENCOUNTER — Telehealth: Payer: Self-pay | Admitting: Behavioral Health

## 2023-01-30 ENCOUNTER — Other Ambulatory Visit: Payer: Self-pay | Admitting: Behavioral Health

## 2023-01-30 DIAGNOSIS — F902 Attention-deficit hyperactivity disorder, combined type: Secondary | ICD-10-CM

## 2023-01-30 MED ORDER — AMPHETAMINE-DEXTROAMPHETAMINE 20 MG PO TABS: 20.0000 mg | ORAL_TABLET | Freq: Two times a day (BID) | ORAL | 0 refills | Status: DC

## 2023-01-30 NOTE — Telephone Encounter (Signed)
PT called requesting a refill on his adderall 20 mg. Pharmacy is [facility] on [address]

## 2023-02-16 ENCOUNTER — Ambulatory Visit (INDEPENDENT_AMBULATORY_CARE_PROVIDER_SITE_OTHER): Payer: 59 | Admitting: Behavioral Health

## 2023-02-16 ENCOUNTER — Encounter: Payer: Self-pay | Admitting: Behavioral Health

## 2023-02-16 DIAGNOSIS — F902 Attention-deficit hyperactivity disorder, combined type: Secondary | ICD-10-CM

## 2023-02-16 DIAGNOSIS — F3342 Major depressive disorder, recurrent, in full remission: Secondary | ICD-10-CM | POA: Diagnosis not present

## 2023-02-16 DIAGNOSIS — F422 Mixed obsessional thoughts and acts: Secondary | ICD-10-CM

## 2023-02-16 NOTE — Progress Notes (Signed)
[facility]  Patient ID: Corey Allison,  MRN: [id number]  PCP: [facility] [place]  Date of Evaluation: 02/16/2023 Time spent:30 minutes  Chief Complaint:  Chief Complaint   ADHD; Follow-up; Medication Refill; Patient Education     HISTORY/CURRENT STATUS: HPI   32 year old male presents to this office for follow up and medication management. No changes this visit.  He is current doing well and says he is very productive at work. No changes in his social life. Says he is still single and not looking.  He has had no issues and likes the way the medication is working for him.  He does not want to make changes on dosage at this time.  He reports anxiety today at 2 and depression 0. He is sleeping 7-8 hours per night. No mania, no psychosis. No SI/H   No prior medication failures      Individual Medical History/ Review of Systems: Changes? :No   Allergies: Patient has no known allergies.  Current Medications:  Current Outpatient Medications:    albuterol (PROVENTIL HFA;VENTOLIN HFA) 108 (90 Base) MCG/ACT inhaler, Inhale 2 puffs into the lungs every 6 (six) hours as needed., Disp: 1 Inhaler, Rfl: 1   amphetamine-dextroamphetamine (ADDERALL) 20 MG tablet, Take 1 tablet (20 mg total) by mouth 2 (two) times daily., Disp: 60 tablet, Rfl: 0   amphetamine-dextroamphetamine (ADDERALL) 20 MG tablet, Take 1 tablet (20 mg total) by mouth 2 (two) times daily., Disp: 60 tablet, Rfl: 0   amphetamine-dextroamphetamine (ADDERALL) 20 MG tablet, Take 1 tablet (20 mg total) by mouth 2 (two) times daily., Disp: 60 tablet, Rfl: 0   amphetamine-dextroamphetamine (ADDERALL) 20 MG tablet, Take 1 tablet (20 mg total) by mouth 2 (two) times daily., Disp: 60 tablet, Rfl: 0   amphetamine-dextroamphetamine (ADDERALL) 20 MG tablet, Take 1 tablet (20 mg total) by mouth 2 (two) times daily., Disp: 60 tablet, Rfl: 0   azithromycin (ZITHROMAX) 250 MG tablet, Start with 2 tablets  today, then 1 daily thereafter. (Patient not taking: No sig reported), Disp: 6 tablet, Rfl: 0   benzonatate (TESSALON) 100 MG capsule, Take 1-2 capsules (100-200 mg total) by mouth 3 (three) times daily as needed. (Patient not taking: No sig reported), Disp: 60 capsule, Rfl: 0   cetirizine (ZYRTEC) 10 MG tablet, Take 1 tablet (10 mg total) by mouth daily. (Patient not taking: No sig reported), Disp: 30 tablet, Rfl: 11   fluvoxaMINE (LUVOX) 100 MG tablet, TAKE 1 TABLET(100 MG) BY MOUTH AT BEDTIME, Disp: 90 tablet, Rfl: 2   HYDROcodone-homatropine (HYCODAN) 5-1.5 MG/5ML syrup, Take 5 mLs by mouth at bedtime as needed. (Patient not taking: No sig reported), Disp: 100 mL, Rfl: 0   Multiple Vitamins-Minerals (MULTIVITAMIN GUMMIES ADULT PO), Take 1 each by mouth daily. (Patient not taking: Reported on 03/10/2021), Disp: , Rfl:    ondansetron (ZOFRAN ODT) 4 MG disintegrating tablet, Take 1 tablet (4 mg total) by mouth every 8 (eight) hours as needed for nausea. (Patient not taking: Reported on 03/10/2021), Disp: 10 tablet, Rfl: 0   predniSONE (DELTASONE) 10 MG tablet, Take 4 tablets (40 mg total) by mouth daily with breakfast. (Patient not taking: No sig reported), Disp: 20 tablet, Rfl: 0   pseudoephedrine (SUDAFED 12 HOUR) 120 MG 12 hr tablet, Take 1 tablet (120 mg total) by mouth 2 (two) times daily. (Patient not taking: No sig reported), Disp: 30 tablet, Rfl: 3 Medication Side Effects: none  Family Medical/ Social History: Changes? No  MENTAL HEALTH EXAM:  There were no vitals taken for this visit.There is no height or weight on file to calculate BMI.  General Appearance: Casual, Neat, and Well Groomed  Eye Contact:  Good  Speech:  Clear and Coherent  Volume:  Normal  Mood:  NA  Affect:  Congruent  Thought Process:  Coherent  Orientation:  Full (Time, Place, and Person)  Thought Content: Logical   Suicidal Thoughts:  No  Homicidal Thoughts:  No  Memory:  WNL  Judgement:  Good  Insight:  Good   Psychomotor Activity:  Normal  Concentration:  Concentration: Good  Recall:  Good  Fund of Knowledge: Good  Language: Good  Assets:  Desire for Improvement  ADL's:  Intact  Cognition: WNL  Prognosis:  Good    DIAGNOSES:    ICD-10-CM   1. Attention deficit hyperactivity disorder (ADHD), combined type, moderate  F90.2     2. Major depression, recurrent, full remission (HCC)  F33.42     3. Mixed obsessional thoughts and acts  F42.2       Receiving Psychotherapy: No    RECOMMENDATIONS:   Greater than 50% of 30 min. face to face time with patient was spent on counseling and coordination of care. No changes this visit. Reviewed importance of not waiting to late before seeking refills on weekend or holidays. Recommended psychotherapy. He has had no issues obtaining his medication recently.   Continue Adderall 20 mg tablet twice daily Will report any worsening symptoms or side effects promptly Patient will follow up in 3 months to reassess Provided emergency contact information Discussed potential benefits, risks, and side effects of stimulants with patient to include increased heart rate, palpitations, insomnia, increased anxiety, increased irritability, or decreased appetite.  Instructed patient to contact office if experiencing any significant tolerability issues.   PDMP reviewed.         Joan Flores, NP

## 2023-03-12 ENCOUNTER — Telehealth: Payer: Self-pay | Admitting: Behavioral Health

## 2023-03-12 NOTE — Telephone Encounter (Signed)
Bessie called at 4:34 to request refill of his Adderal 20mg  2/day.  Appt 8/20.  Send to [facility] #[id number] - [place], [place] - [address] AT [facility] OF [address]

## 2023-03-13 ENCOUNTER — Other Ambulatory Visit: Payer: Self-pay | Admitting: Behavioral Health

## 2023-03-13 DIAGNOSIS — F902 Attention-deficit hyperactivity disorder, combined type: Secondary | ICD-10-CM

## 2023-03-13 MED ORDER — AMPHETAMINE-DEXTROAMPHETAMINE 20 MG PO TABS: 20.0000 mg | ORAL_TABLET | Freq: Two times a day (BID) | ORAL | 0 refills | Status: DC

## 2023-04-24 ENCOUNTER — Other Ambulatory Visit: Payer: Self-pay | Admitting: Behavioral Health

## 2023-04-24 ENCOUNTER — Other Ambulatory Visit: Payer: Self-pay

## 2023-04-24 ENCOUNTER — Telehealth: Payer: Self-pay | Admitting: Behavioral Health

## 2023-04-24 DIAGNOSIS — F900 Attention-deficit hyperactivity disorder, predominantly inattentive type: Secondary | ICD-10-CM

## 2023-04-24 DIAGNOSIS — F902 Attention-deficit hyperactivity disorder, combined type: Secondary | ICD-10-CM

## 2023-04-24 MED ORDER — AMPHETAMINE-DEXTROAMPHETAMINE 20 MG PO TABS: 20.0000 mg | ORAL_TABLET | Freq: Two times a day (BID) | ORAL | 0 refills | Status: DC

## 2023-04-24 NOTE — Telephone Encounter (Signed)
Pended.

## 2023-04-24 NOTE — Telephone Encounter (Signed)
Next visit is 05/22/23. Requesting refill on Adderall 20 mg, requesting a 90 day supply if possible. Pharmacy is:  [facility] #[id number] [place], [place] - [address] AT [facility] OF [address]   Phone: [phone]  Fax: [phone]    Its in stock

## 2023-05-22 ENCOUNTER — Ambulatory Visit: Payer: 59 | Admitting: Behavioral Health

## 2023-06-05 ENCOUNTER — Encounter: Payer: Self-pay | Admitting: Behavioral Health

## 2023-06-05 ENCOUNTER — Ambulatory Visit: Payer: 59 | Admitting: Behavioral Health

## 2023-06-05 DIAGNOSIS — F900 Attention-deficit hyperactivity disorder, predominantly inattentive type: Secondary | ICD-10-CM | POA: Diagnosis not present

## 2023-06-05 MED ORDER — AMPHETAMINE-DEXTROAMPHETAMINE 20 MG PO TABS: 20.0000 mg | ORAL_TABLET | Freq: Two times a day (BID) | ORAL | 0 refills | Status: DC

## 2023-06-05 NOTE — Progress Notes (Signed)
[facility]  Patient ID: Corey Allison,  MRN: [id number]  PCP: [facility] [place]  Date of Evaluation: 06/05/2023 Time spent:30 minutes  Chief Complaint:  Chief Complaint   ADHD; Follow-up; Patient Education; Medication Refill     HISTORY/CURRENT STATUS: HPI  32 year old male presents to this office for follow up and medication management. No changes this visit.  He is current doing well and says he is very productive at work. No changes in his social life. Says he is still single and not looking.  He has had no issues and likes the way the medication is working for him.  He does not want to make changes on dosage at this time.  He reports anxiety today at 2 and depression 0. He is sleeping 7-8 hours per night. No mania, no psychosis. No SI/H   No prior medication failures    Individual Medical History/ Review of Systems: Changes? :No   Allergies: Patient has no known allergies.  Current Medications:  Current Outpatient Medications:    albuterol (PROVENTIL HFA;VENTOLIN HFA) 108 (90 Base) MCG/ACT inhaler, Inhale 2 puffs into the lungs every 6 (six) hours as needed., Disp: 1 Inhaler, Rfl: 1   amphetamine-dextroamphetamine (ADDERALL) 20 MG tablet, Take 1 tablet (20 mg total) by mouth 2 (two) times daily., Disp: 60 tablet, Rfl: 0   amphetamine-dextroamphetamine (ADDERALL) 20 MG tablet, Take 1 tablet (20 mg total) by mouth 2 (two) times daily., Disp: 60 tablet, Rfl: 0   amphetamine-dextroamphetamine (ADDERALL) 20 MG tablet, Take 1 tablet (20 mg total) by mouth 2 (two) times daily., Disp: 60 tablet, Rfl: 0   amphetamine-dextroamphetamine (ADDERALL) 20 MG tablet, Take 1 tablet (20 mg total) by mouth 2 (two) times daily., Disp: 60 tablet, Rfl: 0   [START ON 07/03/2023] amphetamine-dextroamphetamine (ADDERALL) 20 MG tablet, Take 1 tablet (20 mg total) by mouth 2 (two) times daily., Disp: 60 tablet, Rfl: 0   azithromycin (ZITHROMAX) 250 MG tablet, Start  with 2 tablets today, then 1 daily thereafter. (Patient not taking: No sig reported), Disp: 6 tablet, Rfl: 0   benzonatate (TESSALON) 100 MG capsule, Take 1-2 capsules (100-200 mg total) by mouth 3 (three) times daily as needed. (Patient not taking: No sig reported), Disp: 60 capsule, Rfl: 0   cetirizine (ZYRTEC) 10 MG tablet, Take 1 tablet (10 mg total) by mouth daily. (Patient not taking: No sig reported), Disp: 30 tablet, Rfl: 11   fluvoxaMINE (LUVOX) 100 MG tablet, TAKE 1 TABLET(100 MG) BY MOUTH AT BEDTIME, Disp: 90 tablet, Rfl: 2   HYDROcodone-homatropine (HYCODAN) 5-1.5 MG/5ML syrup, Take 5 mLs by mouth at bedtime as needed. (Patient not taking: No sig reported), Disp: 100 mL, Rfl: 0   Multiple Vitamins-Minerals (MULTIVITAMIN GUMMIES ADULT PO), Take 1 each by mouth daily. (Patient not taking: Reported on 03/10/2021), Disp: , Rfl:    ondansetron (ZOFRAN ODT) 4 MG disintegrating tablet, Take 1 tablet (4 mg total) by mouth every 8 (eight) hours as needed for nausea. (Patient not taking: Reported on 03/10/2021), Disp: 10 tablet, Rfl: 0   predniSONE (DELTASONE) 10 MG tablet, Take 4 tablets (40 mg total) by mouth daily with breakfast. (Patient not taking: No sig reported), Disp: 20 tablet, Rfl: 0   pseudoephedrine (SUDAFED 12 HOUR) 120 MG 12 hr tablet, Take 1 tablet (120 mg total) by mouth 2 (two) times daily. (Patient not taking: No sig reported), Disp: 30 tablet, Rfl: 3 Medication Side Effects: none  Family Medical/ Social History: Changes? No  MENTAL HEALTH EXAM:  There were no vitals taken for this visit.There is no height or weight on file to calculate BMI.  General Appearance: Casual, Neat, and Well Groomed  Eye Contact:  Good  Speech:  Clear and Coherent  Volume:  Normal  Mood:  NA  Affect:  Appropriate  Thought Process:  Coherent  Orientation:  Full (Time, Place, and Person)  Thought Content: Logical   Suicidal Thoughts:  No  Homicidal Thoughts:  No  Memory:  WNL  Judgement:  Good   Insight:  Good  Psychomotor Activity:  Normal  Concentration:  Concentration: Good  Recall:  Good  Fund of Knowledge: Good  Language: Good  Assets:  Desire for Improvement  ADL's:  Intact  Cognition: WNL  Prognosis:  Good    DIAGNOSES:    ICD-10-CM   1. Attention deficit hyperactivity disorder (ADHD), predominantly inattentive type  F90.0 amphetamine-dextroamphetamine (ADDERALL) 20 MG tablet      Receiving Psychotherapy: No    RECOMMENDATIONS:   Greater than 50% of 30 min. face to face time with patient was spent on counseling and coordination of care. No changes this visit. Reviewed importance of not waiting to late before seeking refills on weekend or holidays. Recommended psychotherapy. He has had no issues obtaining his medication recently.   Continue Adderall 20 mg tablet twice daily Will report any worsening symptoms or side effects promptly Patient will follow up in 3 months to reassess Provided emergency contact information Discussed potential benefits, risks, and side effects of stimulants with patient to include increased heart rate, palpitations, insomnia, increased anxiety, increased irritability, or decreased appetite.  Instructed patient to contact office if experiencing any significant tolerability issues.   PDMP reviewed.     Joan Flores, NP

## 2023-07-13 ENCOUNTER — Telehealth: Payer: Self-pay | Admitting: Behavioral Health

## 2023-07-13 ENCOUNTER — Other Ambulatory Visit: Payer: Self-pay

## 2023-07-13 DIAGNOSIS — F900 Attention-deficit hyperactivity disorder, predominantly inattentive type: Secondary | ICD-10-CM

## 2023-07-13 MED ORDER — AMPHETAMINE-DEXTROAMPHETAMINE 20 MG PO TABS: 20.0000 mg | ORAL_TABLET | Freq: Two times a day (BID) | ORAL | 0 refills | Status: DC

## 2023-07-13 NOTE — Telephone Encounter (Signed)
Patient called requesting pharmacy change. He needs his Adderall 20mg  sent to [facility] [address] [place] Ph: [phone] Appt 1/7

## 2023-07-13 NOTE — Telephone Encounter (Signed)
Canceled Rx at [facility] and repended for [facility].

## 2023-08-15 ENCOUNTER — Encounter: Payer: Self-pay | Admitting: Psychiatry

## 2023-08-23 ENCOUNTER — Other Ambulatory Visit: Payer: Self-pay

## 2023-08-23 ENCOUNTER — Telehealth: Payer: Self-pay | Admitting: Behavioral Health

## 2023-08-23 DIAGNOSIS — F902 Attention-deficit hyperactivity disorder, combined type: Secondary | ICD-10-CM

## 2023-08-23 NOTE — Telephone Encounter (Signed)
Pended Adderall 20 mg BID to [facility] per request.

## 2023-08-23 NOTE — Telephone Encounter (Signed)
Patient lvm requesting a refill on the Adderall. Fill at [facility] [id number] [place], [place] - [address] [place], [place] [address] Phone: [phone]  Fax: [phone]   Last seen 9/3, with a follow up on 10/09/23

## 2023-08-24 MED ORDER — AMPHETAMINE-DEXTROAMPHETAMINE 20 MG PO TABS: 20.0000 mg | ORAL_TABLET | Freq: Two times a day (BID) | ORAL | 0 refills | Status: DC

## 2023-09-04 ENCOUNTER — Other Ambulatory Visit: Payer: Self-pay | Admitting: Behavioral Health

## 2023-09-04 DIAGNOSIS — F3342 Major depressive disorder, recurrent, in full remission: Secondary | ICD-10-CM

## 2023-09-04 DIAGNOSIS — F422 Mixed obsessional thoughts and acts: Secondary | ICD-10-CM

## 2023-10-08 ENCOUNTER — Telehealth: Payer: Self-pay | Admitting: Behavioral Health

## 2023-10-08 ENCOUNTER — Other Ambulatory Visit: Payer: Self-pay

## 2023-10-08 DIAGNOSIS — F902 Attention-deficit hyperactivity disorder, combined type: Secondary | ICD-10-CM

## 2023-10-08 MED ORDER — AMPHETAMINE-DEXTROAMPHETAMINE 20 MG PO TABS: 20.0000 mg | ORAL_TABLET | Freq: Two times a day (BID) | ORAL | 0 refills | Status: DC

## 2023-10-08 NOTE — Telephone Encounter (Signed)
 Pt lvm that he needs a refill on his adderall 20 mg. The pharmacy is [facility] [address]. He has an appt tomorrow. The pharmacy does have it in stock today

## 2023-10-08 NOTE — Telephone Encounter (Signed)
 Pended Rx for Adderall 20 mg #60, to [facility] on [address]. He has an appt tomorrow.

## 2023-10-09 ENCOUNTER — Ambulatory Visit: Payer: 59 | Admitting: Behavioral Health

## 2023-10-09 ENCOUNTER — Encounter: Payer: Self-pay | Admitting: Behavioral Health

## 2023-10-09 DIAGNOSIS — F902 Attention-deficit hyperactivity disorder, combined type: Secondary | ICD-10-CM | POA: Diagnosis not present

## 2023-10-09 DIAGNOSIS — F422 Mixed obsessional thoughts and acts: Secondary | ICD-10-CM | POA: Diagnosis not present

## 2023-10-09 DIAGNOSIS — F3342 Major depressive disorder, recurrent, in full remission: Secondary | ICD-10-CM

## 2023-10-09 NOTE — Progress Notes (Signed)
 [facility]  Patient ID: Corey Allison,  MRN: [id number]  PCP: [facility] [place]  Date of Evaluation: 10/09/2023 Time spent:30 minutes  Chief Complaint:  Chief Complaint   Anxiety; Depression; Follow-up; Patient Education; Medication Refill; ADHD     HISTORY/CURRENT STATUS: HPI  33 year old male presents to this office for follow up and medication management. No changes this visit.  He is current doing well and says he is very productive at work. No changes in his social life. Says he is still single and not looking.  He has had no issues and likes the way the medication is working for him.  He does not want to make changes on dosage at this time.  He reports anxiety today at 2 and depression 0. He is sleeping 7-8 hours per night. No mania, no psychosis. No SI/H   No prior medication failures   Individual Medical History/ Review of Systems: Changes? :No   Allergies: Patient has no known allergies.  Current Medications:  Current Outpatient Medications:    albuterol  (PROVENTIL  HFA;VENTOLIN  HFA) 108 (90 Base) MCG/ACT inhaler, Inhale 2 puffs into the lungs every 6 (six) hours as needed., Disp: 1 Inhaler, Rfl: 1   amphetamine -dextroamphetamine  (ADDERALL) 20 MG tablet, Take 1 tablet (20 mg total) by mouth 2 (two) times daily., Disp: 60 tablet, Rfl: 0   amphetamine -dextroamphetamine  (ADDERALL) 20 MG tablet, Take 1 tablet (20 mg total) by mouth 2 (two) times daily., Disp: 60 tablet, Rfl: 0   amphetamine -dextroamphetamine  (ADDERALL) 20 MG tablet, Take 1 tablet (20 mg total) by mouth 2 (two) times daily., Disp: 60 tablet, Rfl: 0   amphetamine -dextroamphetamine  (ADDERALL) 20 MG tablet, Take 1 tablet (20 mg total) by mouth 2 (two) times daily., Disp: 60 tablet, Rfl: 0   amphetamine -dextroamphetamine  (ADDERALL) 20 MG tablet, Take 1 tablet (20 mg total) by mouth 2 (two) times daily., Disp: 60 tablet, Rfl: 0   azithromycin  (ZITHROMAX ) 250 MG tablet, Start  with 2 tablets today, then 1 daily thereafter. (Patient not taking: No sig reported), Disp: 6 tablet, Rfl: 0   benzonatate  (TESSALON ) 100 MG capsule, Take 1-2 capsules (100-200 mg total) by mouth 3 (three) times daily as needed. (Patient not taking: No sig reported), Disp: 60 capsule, Rfl: 0   cetirizine  (ZYRTEC ) 10 MG tablet, Take 1 tablet (10 mg total) by mouth daily. (Patient not taking: No sig reported), Disp: 30 tablet, Rfl: 11   fluvoxaMINE  (LUVOX ) 100 MG tablet, TAKE 1 TABLET(100 MG) BY MOUTH AT BEDTIME, Disp: 90 tablet, Rfl: 2   HYDROcodone -homatropine (HYCODAN) 5-1.5 MG/5ML syrup, Take 5 mLs by mouth at bedtime as needed. (Patient not taking: No sig reported), Disp: 100 mL, Rfl: 0   Multiple Vitamins-Minerals (MULTIVITAMIN GUMMIES ADULT PO), Take 1 each by mouth daily. (Patient not taking: Reported on 03/10/2021), Disp: , Rfl:    ondansetron  (ZOFRAN  ODT) 4 MG disintegrating tablet, Take 1 tablet (4 mg total) by mouth every 8 (eight) hours as needed for nausea. (Patient not taking: Reported on 03/10/2021), Disp: 10 tablet, Rfl: 0   predniSONE  (DELTASONE ) 10 MG tablet, Take 4 tablets (40 mg total) by mouth daily with breakfast. (Patient not taking: No sig reported), Disp: 20 tablet, Rfl: 0   pseudoephedrine  (SUDAFED 12 HOUR) 120 MG 12 hr tablet, Take 1 tablet (120 mg total) by mouth 2 (two) times daily. (Patient not taking: No sig reported), Disp: 30 tablet, Rfl: 3 Medication Side Effects: none  Family Medical/ Social History: Changes? No  MENTAL HEALTH  EXAM:  There were no vitals taken for this visit.There is no height or weight on file to calculate BMI.  General Appearance: Casual and Neat  Eye Contact:  Good  Speech:  Clear and Coherent  Volume:  Normal  Mood:  NA  Affect:  Appropriate  Thought Process:  Coherent  Orientation:  Full (Time, Place, and Person)  Thought Content: Logical   Suicidal Thoughts:  No  Homicidal Thoughts:  No  Memory:  WNL  Judgement:  Good  Insight:  Good   Psychomotor Activity:  Normal  Concentration:  Concentration: Good  Recall:  Good  Fund of Knowledge: Good  Language: Good  Assets:  Desire for Improvement  ADL's:  Intact  Cognition: WNL  Prognosis:  Good    DIAGNOSES:    ICD-10-CM   1. Attention deficit hyperactivity disorder (ADHD), combined type, moderate  F90.2     2. Major depression, recurrent, full remission (HCC)  F33.42     3. Mixed obsessional thoughts and acts  F42.2       Receiving Psychotherapy: No    RECOMMENDATIONS:   Greater than 50% of 30 min. face to face time with patient was spent on counseling and coordination of care. No changes this visit. Reviewed importance of not waiting to late before seeking refills on weekend or holidays. Recommended psychotherapy. He has had no issues obtaining his medication recently.   Continue Adderall 20 mg tablet twice daily Will report any worsening symptoms or side effects promptly Patient will follow up in 3 months to reassess Provided emergency contact information Discussed potential benefits, risks, and side effects of stimulants with patient to include increased heart rate, palpitations, insomnia, increased anxiety, increased irritability, or decreased appetite.  Instructed patient to contact office if experiencing any significant tolerability issues.   PDMP reviewed.    Redell DELENA Pizza, NP

## 2023-11-22 ENCOUNTER — Telehealth: Payer: Self-pay | Admitting: Behavioral Health

## 2023-11-22 NOTE — Telephone Encounter (Signed)
Corey Allison called at 3:21 to request refill of his Adderall 20mg .  Appt 5/7.  Send to  [facility] [id number] [place], [place] - [address]

## 2023-11-23 ENCOUNTER — Other Ambulatory Visit: Payer: Self-pay

## 2023-11-23 DIAGNOSIS — F902 Attention-deficit hyperactivity disorder, combined type: Secondary | ICD-10-CM

## 2023-11-23 MED ORDER — AMPHETAMINE-DEXTROAMPHETAMINE 20 MG PO TABS: 20.0000 mg | ORAL_TABLET | Freq: Two times a day (BID) | ORAL | 0 refills | Status: DC

## 2023-11-23 NOTE — Telephone Encounter (Signed)
Pended Adderall 20 mg, #60, to [facility].

## 2024-01-03 ENCOUNTER — Telehealth: Payer: Self-pay | Admitting: Behavioral Health

## 2024-01-03 ENCOUNTER — Other Ambulatory Visit: Payer: Self-pay

## 2024-01-03 DIAGNOSIS — F902 Attention-deficit hyperactivity disorder, combined type: Secondary | ICD-10-CM

## 2024-01-03 MED ORDER — AMPHETAMINE-DEXTROAMPHETAMINE 20 MG PO TABS: 20.0000 mg | ORAL_TABLET | Freq: Two times a day (BID) | ORAL | 0 refills | Status: DC

## 2024-01-03 NOTE — Telephone Encounter (Signed)
 Next appt is 02/06/24. Requesting refill on Adderall called to:  [facility] [id number] [place], [place] - [address]  Phone: [phone]  Fax: [phone]    Its in stock

## 2024-01-03 NOTE — Telephone Encounter (Signed)
 Pended Adderall #60 to [facility].

## 2024-02-06 ENCOUNTER — Ambulatory Visit: Payer: 59 | Admitting: Behavioral Health

## 2024-02-06 ENCOUNTER — Encounter: Payer: Self-pay | Admitting: Behavioral Health

## 2024-02-06 DIAGNOSIS — F3342 Major depressive disorder, recurrent, in full remission: Secondary | ICD-10-CM | POA: Diagnosis not present

## 2024-02-06 DIAGNOSIS — F422 Mixed obsessional thoughts and acts: Secondary | ICD-10-CM

## 2024-02-06 DIAGNOSIS — F902 Attention-deficit hyperactivity disorder, combined type: Secondary | ICD-10-CM

## 2024-02-06 MED ORDER — AMPHETAMINE-DEXTROAMPHETAMINE 20 MG PO TABS: 20.0000 mg | ORAL_TABLET | Freq: Two times a day (BID) | ORAL | 0 refills | Status: DC

## 2024-02-06 MED ORDER — FLUVOXAMINE MALEATE 100 MG PO TABS: ORAL_TABLET | ORAL | 2 refills | Status: DC

## 2024-02-06 NOTE — Progress Notes (Signed)
 [facility]  Patient ID: Corey Allison,  MRN: [id number]  PCP: [facility] [place]  Date of Evaluation: 02/06/2024 Time spent:30 minutes  Chief Complaint:  Chief Complaint   Follow-up; Medication Refill; Patient Education; ADHD; OCD     HISTORY/CURRENT STATUS: HPI 33 year old male presents to this office for follow up and medication management. No changes this visit.  He is current doing well and says he is very productive at work. No changes in his social life. Says he is still single and not looking.  He has had no issues and likes the way the medication is working for him.  He does not want to make changes on dosage at this time.  He reports anxiety today at 2 and depression 0. He is sleeping 7-8 hours per night. No mania, no psychosis. No SI/H   No prior medication failures   Individual Medical History/ Review of Systems: Changes? :No   Allergies: Patient has no known allergies.  Current Medications:  Current Outpatient Medications:    albuterol  (PROVENTIL  HFA;VENTOLIN  HFA) 108 (90 Base) MCG/ACT inhaler, Inhale 2 puffs into the lungs every 6 (six) hours as needed., Disp: 1 Inhaler, Rfl: 1   amphetamine -dextroamphetamine  (ADDERALL) 20 MG tablet, Take 1 tablet (20 mg total) by mouth 2 (two) times daily., Disp: 60 tablet, Rfl: 0   amphetamine -dextroamphetamine  (ADDERALL) 20 MG tablet, Take 1 tablet (20 mg total) by mouth 2 (two) times daily., Disp: 60 tablet, Rfl: 0   amphetamine -dextroamphetamine  (ADDERALL) 20 MG tablet, Take 1 tablet (20 mg total) by mouth 2 (two) times daily., Disp: 60 tablet, Rfl: 0   azithromycin  (ZITHROMAX ) 250 MG tablet, Start with 2 tablets today, then 1 daily thereafter. (Patient not taking: No sig reported), Disp: 6 tablet, Rfl: 0   benzonatate  (TESSALON ) 100 MG capsule, Take 1-2 capsules (100-200 mg total) by mouth 3 (three) times daily as needed. (Patient not taking: No sig reported), Disp: 60 capsule, Rfl: 0    cetirizine  (ZYRTEC ) 10 MG tablet, Take 1 tablet (10 mg total) by mouth daily. (Patient not taking: No sig reported), Disp: 30 tablet, Rfl: 11   fluvoxaMINE  (LUVOX ) 100 MG tablet, TAKE 1 TABLET(100 MG) BY MOUTH AT BEDTIME, Disp: 90 tablet, Rfl: 2   HYDROcodone -homatropine (HYCODAN) 5-1.5 MG/5ML syrup, Take 5 mLs by mouth at bedtime as needed. (Patient not taking: No sig reported), Disp: 100 mL, Rfl: 0   Multiple Vitamins-Minerals (MULTIVITAMIN GUMMIES ADULT PO), Take 1 each by mouth daily. (Patient not taking: Reported on 03/10/2021), Disp: , Rfl:    ondansetron  (ZOFRAN  ODT) 4 MG disintegrating tablet, Take 1 tablet (4 mg total) by mouth every 8 (eight) hours as needed for nausea. (Patient not taking: Reported on 03/10/2021), Disp: 10 tablet, Rfl: 0   predniSONE  (DELTASONE ) 10 MG tablet, Take 4 tablets (40 mg total) by mouth daily with breakfast. (Patient not taking: No sig reported), Disp: 20 tablet, Rfl: 0   pseudoephedrine  (SUDAFED 12 HOUR) 120 MG 12 hr tablet, Take 1 tablet (120 mg total) by mouth 2 (two) times daily. (Patient not taking: No sig reported), Disp: 30 tablet, Rfl: 3 Medication Side Effects: none  Family Medical/ Social History: Changes? No  MENTAL HEALTH EXAM:  There were no vitals taken for this visit.There is no height or weight on file to calculate BMI.  General Appearance: Casual and Neat  Eye Contact:  Good  Speech:  Clear and Coherent  Volume:  Normal  Mood:  NA  Affect:  Appropriate  Thought Process:  Coherent  Orientation:  Full (Time, Place, and Person)  Thought Content: Logical   Suicidal Thoughts:  No  Homicidal Thoughts:  No  Memory:  WNL  Judgement:  Good  Insight:  Good  Psychomotor Activity:  Normal  Concentration:  Concentration: Good  Recall:  Good  Fund of Knowledge: Good  Language: Good  Assets:  Desire for Improvement  ADL's:  Intact  Cognition: WNL  Prognosis:  Good    DIAGNOSES:    ICD-10-CM   1. Major depression, recurrent, full remission  (HCC)  F33.42 fluvoxaMINE  (LUVOX ) 100 MG tablet    2. Mixed obsessional thoughts and acts  F42.2 fluvoxaMINE  (LUVOX ) 100 MG tablet    3. Attention deficit hyperactivity disorder (ADHD), combined type, moderate  F90.2 amphetamine -dextroamphetamine  (ADDERALL) 20 MG tablet      Receiving Psychotherapy: No    RECOMMENDATIONS:   Greater than 50% of 30 min. face to face time with patient was spent on counseling and coordination of care. No changes this visit. Reviewed importance of not waiting to late before seeking refills on weekend or holidays. Recommended psychotherapy. He has had no issues obtaining his medication recently.   Continue Adderall 20 mg tablet twice daily Continue Luvox  100 mg. Pt taking in the am and working well.  Will report any worsening symptoms or side effects promptly Patient will follow up in 4 months to reassess Provided emergency contact information Discussed potential benefits, risks, and side effects of stimulants with patient to include increased heart rate, palpitations, insomnia, increased anxiety, increased irritability, or decreased appetite.  Instructed patient to contact office if experiencing any significant tolerability issues.   PDMP reviewed.   Lincoln Renshaw, NP

## 2024-04-03 ENCOUNTER — Other Ambulatory Visit: Payer: Self-pay

## 2024-04-03 ENCOUNTER — Telehealth: Payer: Self-pay | Admitting: Behavioral Health

## 2024-04-03 DIAGNOSIS — F902 Attention-deficit hyperactivity disorder, combined type: Secondary | ICD-10-CM

## 2024-04-03 MED ORDER — AMPHETAMINE-DEXTROAMPHETAMINE 20 MG PO TABS: 20.0000 mg | ORAL_TABLET | Freq: Two times a day (BID) | ORAL | 0 refills | Status: DC

## 2024-04-03 NOTE — Telephone Encounter (Signed)
 Pt called at 1:35p requesting refill of adderall to   [facility] [id number] [facility], [place] - [address] [place], [place] [address] Phone: [phone]  Fax: [phone]   Next appt 9/8

## 2024-04-03 NOTE — Telephone Encounter (Signed)
 Pended.

## 2024-04-07 NOTE — Telephone Encounter (Signed)
 This wasn't sent to [facility]

## 2024-05-26 ENCOUNTER — Other Ambulatory Visit: Payer: Self-pay

## 2024-05-26 ENCOUNTER — Telehealth: Payer: Self-pay | Admitting: Behavioral Health

## 2024-05-26 DIAGNOSIS — F902 Attention-deficit hyperactivity disorder, combined type: Secondary | ICD-10-CM

## 2024-05-26 MED ORDER — AMPHETAMINE-DEXTROAMPHETAMINE 20 MG PO TABS: 20.0000 mg | ORAL_TABLET | Freq: Two times a day (BID) | ORAL | 0 refills | Status: DC

## 2024-05-26 NOTE — Telephone Encounter (Signed)
 Canceled at [facility] and repended to [facility].

## 2024-05-26 NOTE — Telephone Encounter (Signed)
 Patient called in for refill on Adderall 20mg . PH: [phone] Appt 9/8 Pharmacy [facility] [address] [place]

## 2024-06-09 ENCOUNTER — Ambulatory Visit (INDEPENDENT_AMBULATORY_CARE_PROVIDER_SITE_OTHER): Payer: Self-pay | Admitting: Behavioral Health

## 2024-06-09 DIAGNOSIS — Z91199 Patient's noncompliance with other medical treatment and regimen due to unspecified reason: Secondary | ICD-10-CM

## 2024-06-09 NOTE — Progress Notes (Signed)
 Pt did not show for scheduled appt and did not provide 24 hour access as required. Additional fees to be assessed.

## 2024-07-09 ENCOUNTER — Ambulatory Visit: Admitting: Behavioral Health

## 2024-07-09 ENCOUNTER — Encounter: Payer: Self-pay | Admitting: Behavioral Health

## 2024-07-09 DIAGNOSIS — F3342 Major depressive disorder, recurrent, in full remission: Secondary | ICD-10-CM | POA: Diagnosis not present

## 2024-07-09 DIAGNOSIS — F902 Attention-deficit hyperactivity disorder, combined type: Secondary | ICD-10-CM

## 2024-07-09 DIAGNOSIS — F422 Mixed obsessional thoughts and acts: Secondary | ICD-10-CM

## 2024-07-09 DIAGNOSIS — F900 Attention-deficit hyperactivity disorder, predominantly inattentive type: Secondary | ICD-10-CM

## 2024-07-09 MED ORDER — AMPHETAMINE-DEXTROAMPHETAMINE 20 MG PO TABS: 20.0000 mg | ORAL_TABLET | Freq: Two times a day (BID) | ORAL | 0 refills | Status: DC

## 2024-07-09 MED ORDER — FLUVOXAMINE MALEATE 100 MG PO TABS: ORAL_TABLET | ORAL | 2 refills | Status: AC

## 2024-07-09 NOTE — Progress Notes (Signed)
 [facility]  Patient ID: Corey Allison,  MRN: [id number]  PCP: [facility] [place]  Date of Evaluation: 07/09/2024 Time spent:30 minutes  Chief Complaint:  Chief Complaint   Anxiety; Depression; Follow-up; Patient Education; Medication Refill; ADHD     HISTORY/CURRENT STATUS: HPI 33 year old male presents to this office for follow up and medication management. No changes this visit.  He is current doing well and says he is very productive at work. No changes in his social life. Says he is still single but actively dating. Says that he is happy for the most part. He is open to trying a slight increase in Luvox  for symptoms of OCD.    He reports anxiety today at 2 and depression 0. He is sleeping 7-8 hours per night. No mania, no psychosis. No SI/H   No prior medication failures       Individual Medical History/ Review of Systems: Changes? :No   Allergies: Patient has no known allergies.  Current Medications:  Current Outpatient Medications:    albuterol  (PROVENTIL  HFA;VENTOLIN  HFA) 108 (90 Base) MCG/ACT inhaler, Inhale 2 puffs into the lungs every 6 (six) hours as needed., Disp: 1 Inhaler, Rfl: 1   amphetamine -dextroamphetamine  (ADDERALL) 20 MG tablet, Take 1 tablet (20 mg total) by mouth 2 (two) times daily., Disp: 60 tablet, Rfl: 0   amphetamine -dextroamphetamine  (ADDERALL) 20 MG tablet, Take 1 tablet (20 mg total) by mouth 2 (two) times daily., Disp: 60 tablet, Rfl: 0   amphetamine -dextroamphetamine  (ADDERALL) 20 MG tablet, Take 1 tablet (20 mg total) by mouth 2 (two) times daily., Disp: 60 tablet, Rfl: 0   amphetamine -dextroamphetamine  (ADDERALL) 20 MG tablet, Take 1 tablet (20 mg total) by mouth 2 (two) times daily., Disp: 60 tablet, Rfl: 0   amphetamine -dextroamphetamine  (ADDERALL) 20 MG tablet, Take 1 tablet (20 mg total) by mouth 2 (two) times daily., Disp: 60 tablet, Rfl: 0   azithromycin  (ZITHROMAX ) 250 MG tablet, Start with 2  tablets today, then 1 daily thereafter. (Patient not taking: No sig reported), Disp: 6 tablet, Rfl: 0   benzonatate  (TESSALON ) 100 MG capsule, Take 1-2 capsules (100-200 mg total) by mouth 3 (three) times daily as needed. (Patient not taking: No sig reported), Disp: 60 capsule, Rfl: 0   cetirizine  (ZYRTEC ) 10 MG tablet, Take 1 tablet (10 mg total) by mouth daily. (Patient not taking: No sig reported), Disp: 30 tablet, Rfl: 11   fluvoxaMINE  (LUVOX ) 100 MG tablet, TAKE 1 TABLET(100 MG) BY MOUTH AT BEDTIME, Disp: 90 tablet, Rfl: 2   HYDROcodone -homatropine (HYCODAN) 5-1.5 MG/5ML syrup, Take 5 mLs by mouth at bedtime as needed. (Patient not taking: No sig reported), Disp: 100 mL, Rfl: 0   Multiple Vitamins-Minerals (MULTIVITAMIN GUMMIES ADULT PO), Take 1 each by mouth daily. (Patient not taking: Reported on 03/10/2021), Disp: , Rfl:    ondansetron  (ZOFRAN  ODT) 4 MG disintegrating tablet, Take 1 tablet (4 mg total) by mouth every 8 (eight) hours as needed for nausea. (Patient not taking: Reported on 03/10/2021), Disp: 10 tablet, Rfl: 0   predniSONE  (DELTASONE ) 10 MG tablet, Take 4 tablets (40 mg total) by mouth daily with breakfast. (Patient not taking: No sig reported), Disp: 20 tablet, Rfl: 0   pseudoephedrine  (SUDAFED 12 HOUR) 120 MG 12 hr tablet, Take 1 tablet (120 mg total) by mouth 2 (two) times daily. (Patient not taking: No sig reported), Disp: 30 tablet, Rfl: 3 Medication Side Effects: none  Family Medical/ Social History: Changes? No  MENTAL HEALTH EXAM:  There were no vitals taken for this visit.There is no height or weight on file to calculate BMI.  General Appearance: Casual and Neat  Eye Contact:  NA  Speech:  Clear and Coherent  Volume:  Normal  Mood:  NA  Affect:  Appropriate  Thought Process:  Coherent  Orientation:  Full (Time, Place, and Person)  Thought Content: Logical   Suicidal Thoughts:  No  Homicidal Thoughts:  No  Memory:  WNL  Judgement:  Good  Insight:  Good   Psychomotor Activity:  Normal  Concentration:  Concentration: Good  Recall:  Good  Fund of Knowledge: Good  Language: Good  Assets:  Desire for Improvement  ADL's:  Intact  Cognition: WNL  Prognosis:  Good    DIAGNOSES:    ICD-10-CM   1. Attention deficit hyperactivity disorder (ADHD), combined type, moderate  F90.2     2. Major depression, recurrent, full remission  F33.42     3. Mixed obsessional thoughts and acts  F42.2     4. Attention deficit hyperactivity disorder (ADHD), predominantly inattentive type  F90.0       Receiving Psychotherapy: No    RECOMMENDATIONS:   Greater than 50% of 30 min. face to face time with patient was spent on counseling and coordination of care. No changes this visit. Reviewed importance of not waiting to late before seeking refills on weekend or holidays. Recommended psychotherapy. He has had no issues obtaining his medication recently.   Continue Adderall 20 mg tablet twice daily Increase  Luvox  to 150 mg. Pt taking in the am and working well.  Will report any worsening symptoms or side effects promptly Patient will follow up in 4 months to reassess Provided emergency contact information Discussed potential benefits, risks, and side effects of stimulants with patient to include increased heart rate, palpitations, insomnia, increased anxiety, increased irritability, or decreased appetite.  Instructed patient to contact office if experiencing any significant tolerability issues.   PDMP reviewed.      Redell DELENA Pizza, NP

## 2024-08-25 ENCOUNTER — Other Ambulatory Visit: Payer: Self-pay

## 2024-08-25 ENCOUNTER — Telehealth: Payer: Self-pay | Admitting: Behavioral Health

## 2024-08-25 DIAGNOSIS — F900 Attention-deficit hyperactivity disorder, predominantly inattentive type: Secondary | ICD-10-CM

## 2024-08-25 DIAGNOSIS — F902 Attention-deficit hyperactivity disorder, combined type: Secondary | ICD-10-CM

## 2024-08-25 MED ORDER — AMPHETAMINE-DEXTROAMPHETAMINE 20 MG PO TABS
20.0000 mg | ORAL_TABLET | Freq: Two times a day (BID) | ORAL | 0 refills | Status: AC
Start: 2024-09-22 — End: 2024-10-22

## 2024-08-25 MED ORDER — AMPHETAMINE-DEXTROAMPHETAMINE 20 MG PO TABS: 20.0000 mg | ORAL_TABLET | Freq: Two times a day (BID) | ORAL | 0 refills | Status: AC

## 2024-08-25 NOTE — Telephone Encounter (Signed)
Pended 3 RF.

## 2024-08-25 NOTE — Telephone Encounter (Signed)
 Patient called for refill on Addderall 20mg . Ph: [phone] Appt  2/9 [facility]acy [facility] [address] [place]

## 2024-11-10 ENCOUNTER — Ambulatory Visit: Admitting: Behavioral Health
